# Patient Record
Sex: Female | Born: 1970 | Race: White | Hispanic: Yes | Marital: Single | Smoking: Never smoker
Health system: Southern US, Community
[De-identification: ages and names within clinical notes are randomized; demographics above are authoritative.]

## PROBLEM LIST (undated history)

## (undated) DIAGNOSIS — R87629 Unspecified abnormal cytological findings in specimens from vagina: Secondary | ICD-10-CM

## (undated) DIAGNOSIS — O009 Unspecified ectopic pregnancy without intrauterine pregnancy: Secondary | ICD-10-CM

## (undated) DIAGNOSIS — Z87442 Personal history of urinary calculi: Secondary | ICD-10-CM

## (undated) DIAGNOSIS — N200 Calculus of kidney: Secondary | ICD-10-CM

## (undated) DIAGNOSIS — Z9889 Other specified postprocedural states: Secondary | ICD-10-CM

## (undated) DIAGNOSIS — R112 Nausea with vomiting, unspecified: Secondary | ICD-10-CM

## (undated) DIAGNOSIS — I10 Essential (primary) hypertension: Secondary | ICD-10-CM

## (undated) HISTORY — DX: Unspecified ectopic pregnancy without intrauterine pregnancy: O00.90

## (undated) HISTORY — DX: Essential (primary) hypertension: I10

## (undated) HISTORY — PX: EYE SURGERY: SHX253

## (undated) HISTORY — DX: Calculus of kidney: N20.0

## (undated) HISTORY — DX: Unspecified abnormal cytological findings in specimens from vagina: R87.629

## (undated) HISTORY — PX: ECTOPIC PREGNANCY SURGERY: SHX613

---

## 2005-02-23 ENCOUNTER — Inpatient Hospital Stay (HOSPITAL_COMMUNITY): Admission: EM | Admit: 2005-02-23 | Discharge: 2005-02-25 | Payer: Self-pay | Admitting: Emergency Medicine

## 2011-12-26 ENCOUNTER — Other Ambulatory Visit (HOSPITAL_COMMUNITY): Payer: Self-pay | Admitting: Nurse Practitioner

## 2011-12-26 DIAGNOSIS — Z139 Encounter for screening, unspecified: Secondary | ICD-10-CM

## 2012-01-08 ENCOUNTER — Ambulatory Visit (HOSPITAL_COMMUNITY): Payer: Self-pay

## 2012-01-11 ENCOUNTER — Ambulatory Visit (HOSPITAL_COMMUNITY)
Admission: RE | Admit: 2012-01-11 | Discharge: 2012-01-11 | Disposition: A | Discharge status: Home / Self Care | Payer: Self-pay | Source: Ambulatory Visit | Attending: Nurse Practitioner | Admitting: Nurse Practitioner

## 2012-01-11 DIAGNOSIS — Z139 Encounter for screening, unspecified: Secondary | ICD-10-CM

## 2012-01-17 ENCOUNTER — Other Ambulatory Visit (HOSPITAL_COMMUNITY): Payer: Self-pay | Admitting: Nurse Practitioner

## 2012-01-17 DIAGNOSIS — N6310 Unspecified lump in the right breast, unspecified quadrant: Secondary | ICD-10-CM

## 2012-01-17 DIAGNOSIS — N6320 Unspecified lump in the left breast, unspecified quadrant: Secondary | ICD-10-CM

## 2012-01-31 ENCOUNTER — Other Ambulatory Visit (HOSPITAL_COMMUNITY): Payer: Self-pay | Admitting: Nurse Practitioner

## 2012-01-31 ENCOUNTER — Ambulatory Visit (HOSPITAL_COMMUNITY)
Admission: RE | Admit: 2012-01-31 | Discharge: 2012-01-31 | Disposition: A | Discharge status: Home / Self Care | Payer: PRIVATE HEALTH INSURANCE | Source: Ambulatory Visit | Attending: Nurse Practitioner | Admitting: Nurse Practitioner

## 2012-01-31 DIAGNOSIS — N6320 Unspecified lump in the left breast, unspecified quadrant: Secondary | ICD-10-CM

## 2012-01-31 DIAGNOSIS — R928 Other abnormal and inconclusive findings on diagnostic imaging of breast: Secondary | ICD-10-CM | POA: Insufficient documentation

## 2012-01-31 DIAGNOSIS — N6310 Unspecified lump in the right breast, unspecified quadrant: Secondary | ICD-10-CM

## 2012-08-22 ENCOUNTER — Other Ambulatory Visit: Payer: Self-pay | Admitting: Nurse Practitioner

## 2012-08-22 ENCOUNTER — Other Ambulatory Visit (HOSPITAL_COMMUNITY): Payer: Self-pay | Admitting: Nurse Practitioner

## 2012-08-22 DIAGNOSIS — N6310 Unspecified lump in the right breast, unspecified quadrant: Secondary | ICD-10-CM

## 2012-09-11 ENCOUNTER — Ambulatory Visit (HOSPITAL_COMMUNITY)
Admission: RE | Admit: 2012-09-11 | Discharge: 2012-09-11 | Disposition: A | Discharge status: Home / Self Care | Payer: PRIVATE HEALTH INSURANCE | Source: Ambulatory Visit | Attending: Nurse Practitioner | Admitting: Nurse Practitioner

## 2012-09-11 ENCOUNTER — Encounter (HOSPITAL_COMMUNITY): Payer: Self-pay

## 2012-09-11 DIAGNOSIS — Z09 Encounter for follow-up examination after completed treatment for conditions other than malignant neoplasm: Secondary | ICD-10-CM | POA: Insufficient documentation

## 2012-09-11 DIAGNOSIS — N6310 Unspecified lump in the right breast, unspecified quadrant: Secondary | ICD-10-CM

## 2012-09-11 DIAGNOSIS — N6489 Other specified disorders of breast: Secondary | ICD-10-CM | POA: Insufficient documentation

## 2013-03-11 ENCOUNTER — Other Ambulatory Visit (HOSPITAL_COMMUNITY): Payer: Self-pay | Admitting: *Deleted

## 2013-03-11 DIAGNOSIS — Z09 Encounter for follow-up examination after completed treatment for conditions other than malignant neoplasm: Secondary | ICD-10-CM

## 2013-03-11 DIAGNOSIS — N6489 Other specified disorders of breast: Secondary | ICD-10-CM

## 2013-03-11 DIAGNOSIS — R928 Other abnormal and inconclusive findings on diagnostic imaging of breast: Secondary | ICD-10-CM

## 2013-03-19 ENCOUNTER — Ambulatory Visit (HOSPITAL_COMMUNITY)
Admission: RE | Admit: 2013-03-19 | Discharge: 2013-03-19 | Disposition: A | Discharge status: Home / Self Care | Payer: PRIVATE HEALTH INSURANCE | Source: Ambulatory Visit | Attending: *Deleted | Admitting: *Deleted

## 2013-03-19 DIAGNOSIS — Z09 Encounter for follow-up examination after completed treatment for conditions other than malignant neoplasm: Secondary | ICD-10-CM

## 2013-03-19 DIAGNOSIS — N6489 Other specified disorders of breast: Secondary | ICD-10-CM

## 2013-03-19 DIAGNOSIS — Z1231 Encounter for screening mammogram for malignant neoplasm of breast: Secondary | ICD-10-CM | POA: Insufficient documentation

## 2013-03-19 DIAGNOSIS — R928 Other abnormal and inconclusive findings on diagnostic imaging of breast: Secondary | ICD-10-CM

## 2014-12-31 ENCOUNTER — Other Ambulatory Visit (HOSPITAL_COMMUNITY): Payer: Self-pay | Admitting: *Deleted

## 2014-12-31 DIAGNOSIS — Z1231 Encounter for screening mammogram for malignant neoplasm of breast: Secondary | ICD-10-CM

## 2015-01-18 ENCOUNTER — Ambulatory Visit (HOSPITAL_COMMUNITY): Payer: Self-pay

## 2015-01-25 ENCOUNTER — Ambulatory Visit (HOSPITAL_COMMUNITY)
Admission: RE | Admit: 2015-01-25 | Discharge: 2015-01-25 | Disposition: A | Discharge status: Home / Self Care | Payer: PRIVATE HEALTH INSURANCE | Source: Ambulatory Visit | Attending: *Deleted | Admitting: *Deleted

## 2015-01-25 DIAGNOSIS — Z1231 Encounter for screening mammogram for malignant neoplasm of breast: Secondary | ICD-10-CM | POA: Insufficient documentation

## 2018-07-31 ENCOUNTER — Other Ambulatory Visit: Payer: Self-pay | Admitting: *Deleted

## 2018-07-31 DIAGNOSIS — R1013 Epigastric pain: Secondary | ICD-10-CM

## 2018-07-31 DIAGNOSIS — R7401 Elevation of levels of liver transaminase levels: Secondary | ICD-10-CM

## 2018-07-31 DIAGNOSIS — R102 Pelvic and perineal pain: Secondary | ICD-10-CM

## 2018-08-05 ENCOUNTER — Other Ambulatory Visit: Payer: Self-pay

## 2018-08-05 ENCOUNTER — Other Ambulatory Visit: Payer: Self-pay | Admitting: *Deleted

## 2018-08-05 ENCOUNTER — Ambulatory Visit (HOSPITAL_COMMUNITY)
Admission: RE | Admit: 2018-08-05 | Discharge: 2018-08-05 | Disposition: A | Discharge status: Home / Self Care | Payer: Self-pay | Source: Ambulatory Visit | Attending: *Deleted | Admitting: *Deleted

## 2018-08-05 DIAGNOSIS — R7401 Elevation of levels of liver transaminase levels: Secondary | ICD-10-CM

## 2018-08-05 DIAGNOSIS — R102 Pelvic and perineal pain: Secondary | ICD-10-CM

## 2018-08-05 DIAGNOSIS — R740 Nonspecific elevation of levels of transaminase and lactic acid dehydrogenase [LDH]: Secondary | ICD-10-CM | POA: Insufficient documentation

## 2018-08-05 DIAGNOSIS — R1013 Epigastric pain: Secondary | ICD-10-CM | POA: Insufficient documentation

## 2020-08-12 ENCOUNTER — Encounter: Payer: Self-pay | Admitting: Women's Health

## 2020-08-12 ENCOUNTER — Ambulatory Visit: Payer: Self-pay | Admitting: Women's Health

## 2020-08-12 ENCOUNTER — Other Ambulatory Visit: Payer: Self-pay

## 2020-08-12 ENCOUNTER — Other Ambulatory Visit (HOSPITAL_COMMUNITY)
Admission: RE | Admit: 2020-08-12 | Discharge: 2020-08-12 | Disposition: A | Discharge status: Home / Self Care | Payer: Self-pay | Source: Ambulatory Visit | Attending: Obstetrics & Gynecology | Admitting: Obstetrics & Gynecology

## 2020-08-12 VITALS — BP 124/72 | HR 88 | Ht 61.0 in | Wt 219.0 lb

## 2020-08-12 DIAGNOSIS — N3946 Mixed incontinence: Secondary | ICD-10-CM

## 2020-08-12 DIAGNOSIS — R351 Nocturia: Secondary | ICD-10-CM

## 2020-08-12 DIAGNOSIS — Z124 Encounter for screening for malignant neoplasm of cervix: Secondary | ICD-10-CM

## 2020-08-12 DIAGNOSIS — N9089 Other specified noninflammatory disorders of vulva and perineum: Secondary | ICD-10-CM

## 2020-08-12 DIAGNOSIS — R300 Dysuria: Secondary | ICD-10-CM

## 2020-08-12 DIAGNOSIS — R350 Frequency of micturition: Secondary | ICD-10-CM

## 2020-08-12 NOTE — Progress Notes (Signed)
WELL-WOMAN EXAMINATION Patient name: Brianna [name] MRN [id number]  Date of birth: 07-Aug-1970 Chief Complaint:   vaginal irritation and Urinary Frequency (Leaking urine, constantly wet)  History of Present Illness:   Brianna [name] is a 50 y.o. G13P0020 Hispanic female being seen today for a routine well-woman exam.  Current complaints: urinary incontinence x 24yrs-worse x 99yrs, leaks w/ coughing/sneezing, w/ urge to void, and sometimes leaks randomly. Always wet from urine, has chronic vaginal irritation, and strong odor. Urinary frequency, dysuria. Voids at least 20x/night, feels like she is up every minute. Generalized lower abdominal pain, feels like something is swollen. No sex in 80yrs, never found pleasure in sex, painful. No orgasm. Periods stopped @ 50yo. +hot flashes.  Normal pelvic u/s 38yrs ago.  PCP: [facility]      No LMP recorded. (Menstrual status: Irregular Periods). The current method of family planning is abstinence and post menopausal status.  Last pap 55yrs ago. Results were: negative per pt report at [facility] . H/O abnormal pap: no Last mammogram: 2017. Results were: normal. Family h/o breast cancer: no Last colonoscopy: never. Results were: N/A. Family h/o colorectal cancer: no  Depression screen [facility] 2/9 08/12/2020  Decreased Interest 3  Down, Depressed, Hopeless 3  PHQ - 2 Score 6  Altered sleeping 3  Tired, decreased energy 3  Change in appetite 0  Feeling bad or failure about yourself  2  Trouble concentrating 2  Moving slowly or fidgety/restless 0  Suicidal thoughts 0  PHQ-9 Score 16     GAD 7 : Generalized Anxiety Score 08/12/2020  Nervous, Anxious, on Edge 1  Control/stop worrying 3  Worry too much - different things 1  Trouble relaxing 3  Restless 0  Easily annoyed or irritable 1  Afraid - awful might happen 3  Total GAD 7 Score 12     Review of Systems:   Pertinent items are noted in HPI Denies any headaches, blurred vision, fatigue, shortness  of breath, chest pain, abdominal pain, abnormal vaginal discharge/itching/odor/irritation, problems with periods, bowel movements, urination, or intercourse unless otherwise stated above. Pertinent History Reviewed:  Reviewed past medical,surgical, social and family history.  Reviewed problem list, medications and allergies. Physical Assessment:   Vitals:   08/12/20 1445  BP: 124/72  Pulse: 88  Weight: 219 lb (99.3 kg)  Height: 5\' 1"  (1.549 m)  Body mass index is 41.38 kg/m.        Physical Examination:   General appearance - well appearing, and in no distress  Mental status - alert, oriented to person, place, and time  Psych:  She has a normal mood and affect  Skin - warm and dry, normal color, no suspicious lesions noted  Chest - effort normal, all lung fields clear to auscultation bilaterally  Heart - normal rate and regular rhythm  Neck:  midline trachea, no thyromegaly or nodules  Breasts - breasts appear normal, no suspicious masses, no skin or nipple changes or  axillary nodes  Abdomen - soft, nontender, nondistended, no masses or organomegaly  Pelvic - VULVA: normal appearing vulva with no masses, tenderness or lesions  VAGINA: no obvious cystocele; normal appearing vagina with normal color and discharge, no lesions  CERVIX: normal appearing cervix without discharge or lesions, no CMT  Thin prep pap is done w/ HR HPV cotesting  UTERUS: uterus is felt to be normal size, shape, consistency and nontender   ADNEXA: No adnexal masses or tenderness noted.  Extremities:  No swelling or varicosities noted  Chaperone:  Latisha Cresenzo    No results found for this or any previous visit (from the past 24 hour(s)).  Assessment & Plan:  1) Well-Woman Exam, postmenopausal  2) Mixed urinary incontinence w/ frequency, dysuria, nocturia> send urine cx, gave Mybretriq 50mg  qhs ( of samples), f/u w/ MD  3) Vulvar irritation> likely from incontinence, hopefully will improve w/  myrbetriq  Labs/procedures today: pap, CV swab, urine cx  Mammogram:  past due for screening, self-pay, will see if can get set up w/ [facility]P Colonoscopy:  past due for screening -will see if there are programs for this  Orders Placed This Encounter  Procedures   Urine Culture    Meds: No orders of the defined types were placed in this encounter.   Follow-up: Return in about 2 months (around 10/12/2020) for MD only.  12/12/2020 CNM, [facility] 08/12/2020 4:41 PM

## 2020-08-13 NOTE — Progress Notes (Signed)
This encounter was created in error - please disregard. Visit type was changed.

## 2020-08-15 LAB — URINE CULTURE

## 2020-08-20 LAB — CYTOLOGY - PAP
Chlamydia: NEGATIVE
Comment: NEGATIVE
Comment: NEGATIVE
Comment: NEGATIVE
Comment: NORMAL
HPV 16: NEGATIVE
HPV 18 / 45: NEGATIVE
High risk HPV: POSITIVE — AB
Neisseria Gonorrhea: NEGATIVE

## 2020-08-26 ENCOUNTER — Encounter: Payer: Self-pay | Admitting: Women's Health

## 2020-09-21 ENCOUNTER — Encounter: Payer: Self-pay | Admitting: Women's Health

## 2020-09-21 DIAGNOSIS — R87619 Unspecified abnormal cytological findings in specimens from cervix uteri: Secondary | ICD-10-CM | POA: Insufficient documentation

## 2020-10-14 ENCOUNTER — Ambulatory Visit: Payer: Self-pay | Admitting: Obstetrics & Gynecology

## 2020-10-15 ENCOUNTER — Other Ambulatory Visit (HOSPITAL_COMMUNITY): Payer: Self-pay | Admitting: Obstetrics and Gynecology

## 2020-10-15 DIAGNOSIS — Z1231 Encounter for screening mammogram for malignant neoplasm of breast: Secondary | ICD-10-CM

## 2020-10-29 ENCOUNTER — Other Ambulatory Visit: Payer: Self-pay

## 2020-10-29 ENCOUNTER — Inpatient Hospital Stay (HOSPITAL_COMMUNITY): Payer: Self-pay | Attending: Obstetrics and Gynecology | Admitting: *Deleted

## 2020-10-29 ENCOUNTER — Ambulatory Visit (HOSPITAL_COMMUNITY)
Admission: RE | Admit: 2020-10-29 | Discharge: 2020-10-29 | Disposition: A | Discharge status: Home / Self Care | Payer: Self-pay | Source: Ambulatory Visit | Attending: Obstetrics and Gynecology | Admitting: Obstetrics and Gynecology

## 2020-10-29 VITALS — BP 128/78 | Wt 217.0 lb

## 2020-10-29 DIAGNOSIS — Z1239 Encounter for other screening for malignant neoplasm of breast: Secondary | ICD-10-CM

## 2020-10-29 DIAGNOSIS — Z1211 Encounter for screening for malignant neoplasm of colon: Secondary | ICD-10-CM

## 2020-10-29 DIAGNOSIS — Z1231 Encounter for screening mammogram for malignant neoplasm of breast: Secondary | ICD-10-CM | POA: Insufficient documentation

## 2020-10-29 DIAGNOSIS — R87612 Low grade squamous intraepithelial lesion on cytologic smear of cervix (LGSIL): Secondary | ICD-10-CM

## 2020-10-29 NOTE — Progress Notes (Signed)
Brianna [name] is a 50 y.o. female who presents to [facility] clinic today with no complaints. Patient referred to B[facility]CP by [facility] for [facility] at [facility] due to having an abnormal Pap smear 08/12/2020 that a colposcopy is recommended for follow up.   Pap Smear: Pap smear not completed today. Last Pap smear was 08/12/2020 at [facility] for [facility] at [facility] clinic and was abnormal - LSIL with positive HPV . Per patient has no history of an abnormal Pap smear prior to her most recent Pap smear. Last Pap smear result is available in Epic.   Physical exam: Breasts Breasts symmetrical. No skin abnormalities bilateral breasts. No nipple retraction bilateral breasts. No nipple discharge bilateral breasts. No lymphadenopathy. No lumps palpated bilateral breasts. No complaints of pain or tenderness on exam.   Pelvic/Bimanual Pap is not indicated today per BCCCP guidelines.   Smoking History: Patient has never smoked.   Patient Navigation: Patient education provided. Access to services provided for patient through [employer] program. Spanish interpreter Lynnell Chad from [employer] provided.   Colorectal Cancer Screening: Per patient has never had colonoscopy completed. FIT Test given to patient to complete. No complaints today.    Breast and Cervical Cancer Risk Assessment: Patient does not have family history of breast cancer, known genetic mutations, or radiation treatment to the chest before age 36. Patient does not have history of cervical dysplasia, immunocompromised, or DES exposure in-utero.  Risk Assessment     Risk Scores       10/29/2020   Last edited by: Priscille Heidelberg, RN   5-year risk: 0.9 %   Lifetime risk: 7.5 %           A: BCCCP exam without pap smear No complaints.  P: Referred patient to the [facility] for a screening mammogram. Appointment scheduled Friday, October 29, 2020 at  1030.  Referred patient to the [facility] for [facility] for a colposcopy to follow-up for her abnormal Pap smear. Appointment scheduled Wednesday, November 03, 2020 at 1330.  Priscille Heidelberg, RN 10/29/2020 10:16 AM

## 2020-10-29 NOTE — Patient Instructions (Signed)
Explained breast self awareness with Brianna [name]. Patient did not need a Pap smear today due to last Pap smear was 08/12/2020. Explained the colposcopy the recommended follow-up for her abnormal Pap smear. Referred patient to the [facility] for [facility] for a colposcopy to follow-up for her abnormal Pap smear. Appointment scheduled Wednesday, November 03, 2020 at 1330. Patient aware of appointment and will be there. Referred patient to the [facility] for a screening mammogram. Appointment scheduled Friday, October 29, 2020 at 1030. Patient escorted to mammography following BCCCP appointment for her screening mammogram. Let patient know [name] will follow up with her within the next couple weeks with results her mammogram by letter or phone. Brianna [name] verbalized understanding.  Amarii Amy, Kathaleen Maser, RN 10:16 AM

## 2020-11-03 ENCOUNTER — Ambulatory Visit (INDEPENDENT_AMBULATORY_CARE_PROVIDER_SITE_OTHER): Payer: Self-pay | Admitting: Women's Health

## 2020-11-03 ENCOUNTER — Other Ambulatory Visit: Payer: Self-pay

## 2020-11-03 ENCOUNTER — Other Ambulatory Visit (HOSPITAL_COMMUNITY)
Admission: RE | Admit: 2020-11-03 | Discharge: 2020-11-03 | Disposition: A | Discharge status: Home / Self Care | Payer: Self-pay | Source: Ambulatory Visit | Attending: Women's Health | Admitting: Women's Health

## 2020-11-03 ENCOUNTER — Encounter: Payer: Self-pay | Admitting: Women's Health

## 2020-11-03 VITALS — BP 120/77 | HR 89 | Ht 61.5 in | Wt 219.0 lb

## 2020-11-03 DIAGNOSIS — R87612 Low grade squamous intraepithelial lesion on cytologic smear of cervix (LGSIL): Secondary | ICD-10-CM

## 2020-11-03 DIAGNOSIS — N3946 Mixed incontinence: Secondary | ICD-10-CM

## 2020-11-03 DIAGNOSIS — N841 Polyp of cervix uteri: Secondary | ICD-10-CM

## 2020-11-03 MED ORDER — MIRABEGRON ER 50 MG PO TB24: 50.0000 mg | ORAL_TABLET | Freq: Every day | ORAL | 6 refills | Status: DC

## 2020-11-03 NOTE — Progress Notes (Signed)
   COLPOSCOPY PROCEDURE NOTE Patient name: Brianna [name] MRN [id number]  Date of birth: 1970/06/13 Subjective Findings:   Brianna [name] is a 50 y.o. G101P0020 Hispanic female being seen today for a colposcopy.  Had seen pt in Aug, was having mixed urinary incontinence- gave sample of mybretriq 50mg , states has helped some, needs more samples. Had mammogram 10/21- neg. Had BCCCP appt 10/21, was given FIT testing for colorectal cancer screening- has done cards and mailed back today. Indication: Abnormal pap on 08/12/20: LSIL w/ HRHPV positive: other (not 16, 18/45)  Prior cytology:  Date Result Procedure  2015 NILM w/ HRHPV negative None              Patient's last menstrual period was 01/20/2015. Contraception: post menopausal status. Menopausal: yes @ 50yo . Hysterectomy: no.   Smoker: no. Immunocompromised: no.   The risks and benefits were explained and informed consent was obtained, and written copy is in chart. Pertinent History Reviewed:   Reviewed past medical,surgical, social, obstetrical and family history.  Reviewed problem list, medications and allergies. Objective Findings & Procedure:   Vitals:   11/03/20 1345  BP: 120/77  Pulse: 89  Weight: 219 lb (99.3 kg)  Height: 5' 1.5" (1.562 m)  Body mass index is 40.71 kg/m.  No results found for this or any previous visit (from the past 24 hour(s)).   Time out was performed.  Speculum placed in the vagina, cervix fully visualized. SCJ: not fully visualized. Cervix swabbed x 3 with acetic acid.  Acetowhitening present: Yes Cervix: no visible lesions, no mosaicism, no punctation, no abnormal vasculature, acetowhite lesion(s) noted at 9 & 3 o'clock, and polyp noted inside the os- removed w/ ring forceps w/o difficulty. 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 only.  Brianna [name] CNM, [facility] 11/03/2020 4:32 PM

## 2020-11-05 LAB — SURGICAL PATHOLOGY

## 2020-11-11 ENCOUNTER — Telehealth: Payer: Self-pay | Admitting: Women's Health

## 2020-11-11 NOTE — Telephone Encounter (Signed)
Spoke with Patient regarding her Colposcopy Biopsy in spanish. I informed all of the information that was provider by Mrs. Brianna [name] an Patient verbalized understanding.

## 2020-11-17 LAB — FECAL OCCULT BLOOD, IMMUNOCHEMICAL

## 2020-11-22 ENCOUNTER — Other Ambulatory Visit: Payer: Self-pay

## 2020-11-22 DIAGNOSIS — Z1211 Encounter for screening for malignant neoplasm of colon: Secondary | ICD-10-CM

## 2020-12-27 ENCOUNTER — Ambulatory Visit: Payer: Self-pay | Admitting: Obstetrics & Gynecology

## 2021-05-10 ENCOUNTER — Ambulatory Visit: Payer: 59 | Admitting: Adult Health

## 2021-05-10 ENCOUNTER — Encounter: Payer: Self-pay | Admitting: Adult Health

## 2021-05-10 VITALS — BP 147/78 | HR 72 | Ht 62.0 in | Wt 222.0 lb

## 2021-05-10 DIAGNOSIS — R350 Frequency of micturition: Secondary | ICD-10-CM | POA: Diagnosis not present

## 2021-05-10 DIAGNOSIS — N3946 Mixed incontinence: Secondary | ICD-10-CM

## 2021-05-10 DIAGNOSIS — N951 Menopausal and female climacteric states: Secondary | ICD-10-CM | POA: Diagnosis not present

## 2021-05-10 DIAGNOSIS — R300 Dysuria: Secondary | ICD-10-CM | POA: Diagnosis not present

## 2021-05-10 LAB — POCT URINALYSIS DIPSTICK
Blood, UA: NEGATIVE
Glucose, UA: NEGATIVE
Ketones, UA: NEGATIVE
Leukocytes, UA: NEGATIVE
Nitrite, UA: NEGATIVE
Protein, UA: NEGATIVE

## 2021-05-10 MED ORDER — MIRABEGRON ER 50 MG PO TB24: 50.0000 mg | ORAL_TABLET | Freq: Every day | ORAL | 6 refills | Status: DC

## 2021-05-10 MED ORDER — PREMARIN 0.625 MG/GM VA CREA: TOPICAL_CREAM | VAGINAL | 1 refills | Status: DC

## 2021-05-10 NOTE — Progress Notes (Signed)
?  Subjective:  ?  ? Patient ID: Brianna [name], female   DOB: 29-Jul-1970, 51 y.o.   MRN: [id number] ? ?HPI ?Brianna [name] is a 51 year old Hispanic female, single, PM in complaining of urinary frequency with burning and leakage. She is on myrbetriq, and it is not helping that much any more. Lily an interpreter is with her.  ? ?Lab Results  ?Component Value Date  ? 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 PCP is Grier Rocher NP at [facility]. ? ?Review of Systems ?+urinary frequency ?+burning with urination ?Has mixed UI ?Reviewed past medical,surgical, social and family history. Reviewed medications and allergies.  ?   ?Objective:  ? Physical Exam ?BP (!) 147/78 (BP Location: Left Arm, Patient Position: Sitting, Cuff Size: Large)   Pulse 72   Ht 5\' 2"  (1.575 m)   Wt 222 lb (100.7 kg)   LMP 01/20/2015   BMI 40.60 kg/m?  urine dipstick was negative  ?  Skin warm and dry.Pelvic: external genitalia is normal in appearance no lesions, vagina: pale red and loss of rugae and moisture,urethra has no lesions or masses noted, cervix:bulbous, uterus: normal size, shape and contour, non tender, no masses felt, adnexa: no masses or tenderness noted. Bladder is non tender and no masses felt. Fall risk is low ? Upstream - 05/10/21 1416   ? ?  ? Pregnancy Intention Screening  ? Does the patient want to become pregnant in the next year? No   ? Does the patient's partner want to become pregnant in the next year? No   ? Would the patient like to discuss contraceptive options today? No   ?  ? Contraception Wrap Up  ? Current Method No Method - Other Reason   postmenopausal  ? End Method No Method - Other Reason   postmenopausal  ? Contraception Counseling Provided No   ? ?  ?  ? ?  ? Examination chaperoned by 07/10/21 LPN. ? ?Assessment:  ?1. Urinary frequency ?Will send urine for UA C&S ?- POCT Urinalysis Dipstick ?- Ambulatory referral to Urology ?Continue  myrbetriq 50 mg 1 daily til after sees urology, meds may be changed then  ? ?2. Burning with urination ?Urine sent for UA C&S to rule out UTI ? ?3. Mixed stress and urge urinary incontinence ?Will refer to Urology for evaluation, [facility]  ?- Ambulatory referral to Urology ? ?4. Vaginal dryness, menopausal ?Will try premarin vaginal cream, 0.5 gm in vagina at HS for 2 weeks then 3 x weekly 12 gm given and rx sent ?Meds ordered this encounter  ?Medications  ? conjugated estrogens (PREMARIN) vaginal cream  ?  Sig: Use 0.5 gm in vagina at bedtime for 2 weeks then 3 x weekly  ?  Dispense:  42.5 g  ?  Refill:  1  ?  Order Specific Question:   Supervising Provider  ?  Answer:   [name] H [[id number]]  ? mirabegron ER (MYRBETRIQ) 50 MG TB24 tablet  ?  Sig: Take 1 tablet (50 mg total) by mouth at bedtime.  ?  Dispense:  30 tablet  ?  Refill:  6  ?  Order Specific Question:   Supervising Provider  ?  Answer:   Duane Lope H [[id number]]  ?  ?   ?Plan:  ?   ?Return 08/15/21 for pap and physical with 10/15/21 B ?   ?

## 2021-05-13 ENCOUNTER — Other Ambulatory Visit: Payer: Self-pay | Admitting: Adult Health

## 2021-05-13 LAB — URINALYSIS, ROUTINE W REFLEX MICROSCOPIC
Bilirubin, UA: NEGATIVE
Glucose, UA: NEGATIVE
Ketones, UA: NEGATIVE
Leukocytes,UA: NEGATIVE
Nitrite, UA: NEGATIVE
Protein,UA: NEGATIVE
RBC, UA: NEGATIVE
Specific Gravity, UA: 1.006 (ref 1.005–1.030)
Urobilinogen, Ur: 0.2 mg/dL (ref 0.2–1.0)
pH, UA: 6 (ref 5.0–7.5)

## 2021-05-13 LAB — URINE CULTURE

## 2021-05-13 LAB — SPECIMEN STATUS REPORT

## 2021-05-13 MED ORDER — SULFAMETHOXAZOLE-TRIMETHOPRIM 800-160 MG PO TABS: 1.0000 | ORAL_TABLET | Freq: Two times a day (BID) | ORAL | 0 refills | Status: DC

## 2021-05-13 NOTE — Progress Notes (Signed)
Urine culture was +klebsiella will rx septra ds  ?

## 2021-05-16 ENCOUNTER — Ambulatory Visit: Payer: 59 | Admitting: Physician Assistant

## 2021-05-16 VITALS — BP 115/81 | HR 94 | Ht 61.0 in | Wt 218.0 lb

## 2021-05-16 DIAGNOSIS — R1010 Upper abdominal pain, unspecified: Secondary | ICD-10-CM | POA: Diagnosis not present

## 2021-05-16 DIAGNOSIS — R350 Frequency of micturition: Secondary | ICD-10-CM

## 2021-05-16 DIAGNOSIS — R109 Unspecified abdominal pain: Secondary | ICD-10-CM

## 2021-05-16 DIAGNOSIS — N3000 Acute cystitis without hematuria: Secondary | ICD-10-CM

## 2021-05-16 DIAGNOSIS — N952 Postmenopausal atrophic vaginitis: Secondary | ICD-10-CM

## 2021-05-16 DIAGNOSIS — K5900 Constipation, unspecified: Secondary | ICD-10-CM

## 2021-05-16 LAB — URINALYSIS, ROUTINE W REFLEX MICROSCOPIC
Bilirubin, UA: NEGATIVE
Glucose, UA: NEGATIVE
Ketones, UA: NEGATIVE
Nitrite, UA: NEGATIVE
Protein,UA: NEGATIVE
Specific Gravity, UA: 1.02 (ref 1.005–1.030)
Urobilinogen, Ur: 2 mg/dL — ABNORMAL HIGH (ref 0.2–1.0)
pH, UA: 7 (ref 5.0–7.5)

## 2021-05-16 LAB — MICROSCOPIC EXAMINATION: Renal Epithel, UA: NONE SEEN /hpf

## 2021-05-16 LAB — BLADDER SCAN AMB NON-IMAGING: Scan Result: 1

## 2021-05-16 MED ORDER — NITROFURANTOIN MONOHYD MACRO 100 MG PO CAPS: 100.0000 mg | ORAL_CAPSULE | Freq: Two times a day (BID) | ORAL | 0 refills | Status: AC

## 2021-05-16 NOTE — Progress Notes (Signed)
? ?05/16/2021 ?3:11 PM  ? ?Brianna [name] ?26-Apr-1970 ?[id number] ? ? ?Assessment: ? ?1. Urinary frequency ?- BLADDER SCAN AMB NON-IMAGING ?- Urinalysis, Routine w reflex microscopic ? ?2. Acute cystitis without hematuria ? ?3. Flank pain ? ?4. Pain of upper abdomen ?- CT RENAL STONE STUDY ? ?5. Atrophic vaginitis ? ? ? ?Plan: ?Urine culture ordered. Macrobid x 7 days. CT stone study to eval flank pain and urinary sxs.  Patient will remain on Myrbetriq until follow-up after her stone study for recheck of her urine and further evaluation. MiraLAX recommended for constipation. Explained the plan in detail to the patient via the interpreter.  She is encouraged to continue using the vaginal cream and was instructed on how this should be inserted.  She is advised to start showering rather than taking baths due to the risk of causing UTIs.  Attempts were made to clear up her confusion about her ultrasound performed in 2020 and information is given to her about cholelithiasis.  She conveys that she understands this is not a urologic issue and does not need urgent attention. ? ?Chief Complaint: No chief complaint on file. ? ? ?Referring provider: Adline Potter, NP ?[address] ?[address] ?[place],  [place] [address] ? ? ?History of Present Illness: ?[name] interpreter used during pt's OV ?Brianna [name] is a 51 y.o. year old female who is seen in consultation from Vertis Kelch, NP for evaluation of recurrent UTIs and mixed urinary incontinence.  Her translation from the interpreter, the patient complains of approximately 1 year history of recurrent urinary tract infections with persistent burning, urgency, incontinence, and nocturia.  She also reports persistent right-sided flank pain which has been intermittent over the past year.  Currently, the patient states she wears approximately 3 pads per day due to her urge and stress type incontinence.  She wears no pads at night but reports having to void at least  10 times.  Through translation, she is unable to understand whether this is a significant amount or mounts of urine at night.  She also reports weakness of stream as well as pain with intercourse.  US imaging in the past indicated gallstones, but patient was under the impression that she had kidney stones.  She denies history of kidney stones in the past but is convinced that she has them now.  She also admits to vaginal itching.  No current fever, chills, abdominal pain, gross hematuria. She has chronic constipation. No diarrhea. The patient admits to regular soda intake and reports daily baths rather than showers.  The patient is currently on Myrbetriq 50 mg without improvement of her sxs and was prescribed vaginal cream on 05/10/2021 for diagnosis of atrophic vaginitis. Currently on Bactrim from recent dx of UTI ?Urine cultures: 08/13/2020, mixed urogenital flora, less than 10,000 colonies ?5-23, Klebsiella pneumoniae greater than 100,000 colonies, resistant to ampicillin and intermediate resistance to nitrofurantoin ? ?UA=6-10WBC, moderate bacteria, nitrite negative ?PVR = 1 mL ?Medical records including results and imaging studies reviewed during patient's office visit. ? ?Past Medical History:  ?Past Medical History:  ?Diagnosis Date  ? Ectopic pregnancy   ? Hypertension   ? Kidney stones   ? Vaginal Pap smear, abnormal   ? ? ?Past Surgical History:  ?Past Surgical History:  ?Procedure Laterality Date  ? EYE SURGERY    ? ? ?Allergies:  ?No Known Allergies ? ?Family History:  ?Family History  ?Problem Relation Age of Onset  ? Stroke Father   ? Cancer Mother   ?  stomach  ? Diabetes Brother   ? Hypertension Brother   ? Diabetes Sister   ? Hypertension Sister   ? ? ?Social History:  ?Social History  ? ?Tobacco Use  ? Smoking status: Never  ? Smokeless tobacco: Never  ?Vaping Use  ? Vaping Use: Never used  ?Substance Use Topics  ? Alcohol use: Not Currently  ? Drug use: Never  ? ? ?Review of symptoms:   ?Constitutional:  Negative for unexplained weight loss, fever, chills ?ENT:  Negative for nose bleeds, painful swallowing ?CV:  Negative for chest pain, shortness of breath, exercise intolerance, palpitations, loss of consciousness ?Resp:  Negative for cough, wheezing, shortness of breath ?GI:  Negative for nausea, vomiting, diarrhea, bloody stools ?GU:  Positives noted in HPI ?Neuro:  Negative for seizures, poor balance, limb weakness, slurred speech ?Psych:  Negative for lack of energy, depression, anxiety ?Endocrine:  Negative for polydipsia, polyuria, symptoms of hypoglycemia (dizziness, hunger, sweating) ?Hematologic:  Negative for anemia, purpura, petechia, prolonged or excessive bleeding, use of anticoagulants  ? ?Physical Exam: ?BP 115/81   Pulse 94   Ht 5\' 1"  (1.549 m)   Wt 218 lb (98.9 kg)   LMP 01/20/2015   BMI 41.19 kg/m?   ?Constitutional:  Alert and oriented, No acute distress. ?HEENT: NCAT, moist mucus membranes.  Trachea midline, no masses. ?Cardiovascular: Regular rate and rhythm without murmur, rub, or gallops ?No clubbing, cyanosis, or edema. ?Respiratory: Normal respiratory effort, clear to auscultation bilaterally ?GI: Abdomen is soft, nontender, nondistended, no abdominal masses ?BACK:  Non-tender to palpation.  Mild B CVAT ?Skin: No obvious rashes, warm, dry, intact ?Neurologic: Alert and oriented, Cranial nerves grossly intact, no focal deficits, moving all 4 extremities. ?Psychiatric: Appropriate. Normal mood and affect. ? ?Laboratory Data: ?No results found for this or any previous visit (from the past 24 hour(s)). ? ? ? ?Urinalysis ?   ?Component Value Date/Time  ? APPEARANCEUR Clear 05/10/2021 0000  ? GLUCOSEU Negative 05/10/2021 0000  ? BILIRUBINUR Negative 05/10/2021 0000  ? PROTEINUR Negative 05/10/2021 1417  ? PROTEINUR Negative 05/10/2021 0000  ? NITRITE neg 05/10/2021 1417  ? NITRITE Negative 05/10/2021 0000  ? LEUKOCYTESUR Negative 05/10/2021 1417  ? LEUKOCYTESUR Negative  05/10/2021 0000  ? ? ?Lab Results  ?Component Value Date  ? LABMICR Comment 05/10/2021  ? ? ?Pertinent Imaging: ?No results found for this or any previous visit. ? ?No results found for this or any previous visit. ? ? ? ? ?[place], 07/10/2021, PA-C ?[facility] ?  ?

## 2021-05-16 NOTE — Patient Instructions (Signed)
YES- Estrogen Cream ?YES-Lisinopril-hydrochlorothiazide ?YES-Mirabegron ? ?NO-Sulfamethoxazole ? ?Nueve-Nitrofurantoin ?

## 2021-05-16 NOTE — Progress Notes (Signed)
post void residual =1mL 

## 2021-05-26 ENCOUNTER — Telehealth: Payer: Self-pay

## 2021-05-26 NOTE — Telephone Encounter (Signed)
Moved patients apt to 06/22/2021, called the language line to interpret message for the patient.  They were unabe to reach her at both numbers and unable to leave voicemail.  Will try again at a later time.

## 2021-05-26 NOTE — Telephone Encounter (Signed)
-----   Message from Julienne Annette Summerlin, PA-C sent at 05/26/2021  4:28 PM EDT ----- This pt's CT stone study hasn't been approved yet. She has FU scheduled on 22, but we need to move that out until she has the CT ----- Message ----- From: SYSTEM Sent: 05/21/2021  12:12 AM EDT To: Julienne Annette Summerlin, PA-C   

## 2021-05-30 ENCOUNTER — Ambulatory Visit: Payer: 59 | Admitting: Physician Assistant

## 2021-05-31 ENCOUNTER — Telehealth: Payer: Self-pay

## 2021-05-31 NOTE — Telephone Encounter (Signed)
-----   Message from Brianna [name], [place] sent at 05/26/2021  4:28 PM EDT ----- This pt's CT stone study hasn't been approved yet. She has FU scheduled on 22, but we need to move that out until she has the CT ----- Message ----- From: SYSTEM Sent: 05/21/2021  12:12 AM EDT To: Brianna Levans, PA-C

## 2021-05-31 NOTE — Telephone Encounter (Signed)
R/s apt, pt came in office with an interpreter and front desk informed her that her apt had been move.  Patient also informed to have CT done before her next f/u.  Patient voiced understanding.

## 2021-06-17 ENCOUNTER — Ambulatory Visit (HOSPITAL_COMMUNITY)
Admission: RE | Admit: 2021-06-17 | Discharge: 2021-06-17 | Disposition: A | Discharge status: Home / Self Care | Payer: 59 | Source: Ambulatory Visit | Attending: Physician Assistant | Admitting: Physician Assistant

## 2021-06-17 DIAGNOSIS — R1010 Upper abdominal pain, unspecified: Secondary | ICD-10-CM | POA: Diagnosis present

## 2021-06-17 DIAGNOSIS — N3000 Acute cystitis without hematuria: Secondary | ICD-10-CM | POA: Insufficient documentation

## 2021-06-17 DIAGNOSIS — R109 Unspecified abdominal pain: Secondary | ICD-10-CM | POA: Diagnosis present

## 2021-06-22 ENCOUNTER — Ambulatory Visit: Payer: 59 | Admitting: Physician Assistant

## 2021-06-22 VITALS — BP 115/62 | HR 78

## 2021-06-22 DIAGNOSIS — N3946 Mixed incontinence: Secondary | ICD-10-CM | POA: Diagnosis not present

## 2021-06-22 DIAGNOSIS — N951 Menopausal and female climacteric states: Secondary | ICD-10-CM

## 2021-06-22 DIAGNOSIS — N3000 Acute cystitis without hematuria: Secondary | ICD-10-CM | POA: Diagnosis not present

## 2021-06-22 DIAGNOSIS — R350 Frequency of micturition: Secondary | ICD-10-CM

## 2021-06-22 DIAGNOSIS — K5900 Constipation, unspecified: Secondary | ICD-10-CM

## 2021-06-22 MED ORDER — SULFAMETHOXAZOLE-TRIMETHOPRIM 800-160 MG PO TABS: 1.0000 | ORAL_TABLET | Freq: Two times a day (BID) | ORAL | 0 refills | Status: DC

## 2021-06-22 MED ORDER — GEMTESA 75 MG PO TABS: 75.0000 mg | ORAL_TABLET | Freq: Every day | ORAL | 0 refills | Status: DC

## 2021-06-22 NOTE — Progress Notes (Signed)
post void residual=0 ?

## 2021-06-22 NOTE — Progress Notes (Signed)
Assessment: 1. Acute cystitis without hematuria - Urinalysis, Routine w reflex microscopic - BLADDER SCAN AMB NON-IMAGING - Urine Culture  2. Urinary frequency - Urinalysis, Routine w reflex microscopic - BLADDER SCAN AMB NON-IMAGING  3. Mixed stress and urge urinary incontinence  4. Vaginal dryness, menopausal  5. Constipation, unspecified constipation type    Plan: Urine sent for culture and prescription for Bactrim DS sent to her pharmacy.  Will adjust therapy pending culture results.  Samples of Gemtesa given and she is advised to discontinue the Myrbetriq.  She will continue Premarin cream.  She will follow-up in 6 weeks for UA and PVR.  Possible cystoscopy if symptoms persist or if she is culture positive again.  Chief Complaint: No chief complaint on file.   HPI: Brianna [name] is a 51 y.o. female who presents for continued evaluation of recurrent UTIs and mixed urinary incontinence.  History is obtained via translation interpreter provided by [facility].  Her symptoms improved minimally on Myrbetriq and estrogen cream as previously prescribed, but symptoms recently worsened with urgency, frequency, nocturia up to 10 times per night and states she is always ways wet.  Flank pain has resolved.  No gross hematuria, burning today.  CT stone study indicated no evidence of urinary stone disease.  Discussed CT findings with the patient and again assured her that a gallstone is not related to the urinary symptoms or the urinary system.  UA = 6-10 WBCs, moderate bacteria, nitrite negative.  Culture on 05/10/2021 was positive for Klebsiella pneumonia, greater than 100,000 colonies, resistant to ampicillin with intermediate resistance to Macrobid.  She continues to have issues with constipation as previously discussed  05/16/21 [facility] interpreter used during pt's OV Brianna [name] is a 51 y.o. year old female who is seen in consultation from Vertis KelchAllen, Debra, NP for  evaluation of recurrent UTIs and mixed urinary incontinence.  Her translation from the interpreter, the patient complains of approximately 1 year history of recurrent urinary tract infections with persistent burning, urgency, incontinence, and nocturia.  She also reports persistent right-sided flank pain which has been intermittent over the past year.  Currently, the patient states she wears approximately 3 pads per day due to her urge and stress type incontinence.  She wears no pads at night but reports having to void at least 10 times.  Through translation, she is unable to understand whether this is a significant amount or mounts of urine at night.  She also reports weakness of stream as well as pain with intercourse.  US imaging in the past indicated gallstones, but patient was under the impression that she had kidney stones.  She denies history of kidney stones in the past but is convinced that she has them now.  She also admits to vaginal itching.  No current fever, chills, abdominal pain, gross hematuria. She has chronic constipation. No diarrhea. The patient admits to regular soda intake and reports daily baths rather than showers.  The patient is currently on Myrbetriq 50 mg without improvement of her sxs and was prescribed vaginal cream on 05/10/2021 for diagnosis of atrophic vaginitis. Currently on Bactrim from recent dx of UTI Urine cultures: 08/13/2020, mixed urogenital flora, less than 10,000 colonies 5-23, Klebsiella pneumoniae greater than 100,000 colonies, resistant to ampicillin and intermediate resistance to nitrofurantoin   UA=6-10WBC, moderate bacteria, nitrite negative PVR = 1 mL  Portions of the above documentation were copied from a prior visit for review purposes only.  Allergies: No Known Allergies  PMH: Past  Medical History:  Diagnosis Date   Ectopic pregnancy    Hypertension    Kidney stones    Vaginal Pap smear, abnormal     PSH: Past Surgical History:  Procedure  Laterality Date   EYE SURGERY      SH: Social History   Tobacco Use   Smoking status: Never   Smokeless tobacco: Never  Vaping Use   Vaping Use: Never used  Substance Use Topics   Alcohol use: Not Currently   Drug use: Never    ROS: Constitutional:  Negative for fever, chills, weight loss CV: Negative for chest pain Respiratory:  Negative for shortness of breath, wheezing, sleep apnea, frequent cough GI:  Negative for nausea, vomiting, bloody stool, GERD  PE: BP 115/62   Pulse 78   LMP 01/20/2015  GENERAL APPEARANCE:  Well appearing, well developed, well nourished, NAD HEENT:  Atraumatic, normocephalic NECK:  Supple. Trachea midline ABDOMEN:  Soft, non-tender, no masses EXTREMITIES:  Moves all extremities well, without clubbing, cyanosis, or edema NEUROLOGIC:  Alert and oriented x 3, normal gait, CN II-XII grossly intact MENTAL STATUS:  appropriate BACK:  Non-tender to palpation, No CVAT SKIN:  Warm, dry, and intact   Results: Laboratory Data: No results found for: "WBC", "HGB", "HCT", "MCV", "PLT"  No results found for: "CREATININE"  No results found for: "PSA"  No results found for: "TESTOSTERONE"  No results found for: "HGBA1C"  Urinalysis    Component Value Date/Time   APPEARANCEUR Clear 05/16/2021 1534   GLUCOSEU Negative 05/16/2021 1534   BILIRUBINUR Negative 05/16/2021 1534   PROTEINUR Negative 05/16/2021 1534   NITRITE Negative 05/16/2021 1534   LEUKOCYTESUR 1+ (A) 05/16/2021 1534    Lab Results  Component Value Date   LABMICR See below: 05/16/2021   WBCUA 6-10 (A) 05/16/2021   LABEPIT 0-10 05/16/2021   BACTERIA Moderate (A) 05/16/2021    Pertinent Imaging:  No results found for this or any previous visit.  No results found for this or any previous visit.  No results found for this or any previous visit.  No results found for this or any previous visit.  No results found for this or any previous visit.  No results found for this  or any previous visit.  No results found for this or any previous visit.  Results for orders placed in visit on 05/16/21  CT RENAL STONE STUDY  Narrative CLINICAL DATA:  Flank pain, kidney stone suspected  EXAM: CT ABDOMEN AND PELVIS WITHOUT CONTRAST  TECHNIQUE: Multidetector CT imaging of the abdomen and pelvis was performed following the standard protocol without IV contrast.  RADIATION DOSE REDUCTION: This exam was performed according to the departmental dose-optimization program which includes automated exposure control, adjustment of the mA and/or kV according to patient size and/or use of iterative reconstruction technique.  COMPARISON:  Pelvic ultrasound 07/16/2018  FINDINGS: Lower chest: No acute abnormality.  Hepatobiliary: Normal noncontrast appearance of liver. Peripherally-calcified 0.7 cm gallstone at the gallbladder fundus. No gallbladder wall thickening, or biliary dilatation.  Pancreas: No pancreatic ductal dilatation or surrounding inflammatory changes.  Spleen: Normal in size without focal abnormality.  Adrenals/Urinary Tract: Adrenal glands are unremarkable. Kidneys are normal, without renal calculi, focal lesion, or hydronephrosis. Bladder is unremarkable.  Stomach/Bowel: Stomach is within normal limits. Appendix appears normal. No evidence of bowel wall thickening, distention, or inflammatory changes.  Vascular/Lymphatic: Trace aortic atherosclerosis. No enlarged abdominal or pelvic lymph nodes.  Reproductive: Uterus and bilateral adnexa are unremarkable.  Other: No abdominal wall  hernia or abnormality. No abdominopelvic ascites.  Musculoskeletal: No acute or significant osseous findings.  IMPRESSION: 1. No hydronephrosis or radiodense renal calculus. 2. Cholelithiasis. No additional findings to suggest acute cholecystitis.   Electronically Signed By: Roanna Banning M.D. On: 06/19/2021 19:18  No results found for this or any previous  visit (from the past 24 hour(s)).

## 2021-06-22 NOTE — Patient Instructions (Addendum)
Miralax over the counter as needed for constipation  Gemtesa once daily  STOP Myrbetriq

## 2021-06-23 LAB — URINALYSIS, ROUTINE W REFLEX MICROSCOPIC
Bilirubin, UA: NEGATIVE
Glucose, UA: NEGATIVE
Ketones, UA: NEGATIVE
Nitrite, UA: NEGATIVE
Specific Gravity, UA: 1.02 (ref 1.005–1.030)
Urobilinogen, Ur: 0.2 mg/dL (ref 0.2–1.0)
pH, UA: 7 (ref 5.0–7.5)

## 2021-06-23 LAB — MICROSCOPIC EXAMINATION

## 2021-06-24 ENCOUNTER — Telehealth: Payer: Self-pay

## 2021-06-24 LAB — URINE CULTURE

## 2021-06-24 MED ORDER — AMOXICILLIN-POT CLAVULANATE 875-125 MG PO TABS: 1.0000 | ORAL_TABLET | Freq: Two times a day (BID) | ORAL | 0 refills | Status: DC

## 2021-06-24 NOTE — Telephone Encounter (Signed)
Patient called at both numbers listed with no answer and unable to leave message. Will continue to reach out to pt via language line.

## 2021-06-24 NOTE — Telephone Encounter (Signed)
Patient called with language line interpreter and made aware.

## 2021-06-24 NOTE — Telephone Encounter (Signed)
-----   Message from Sydnee Levans, [place] sent at 06/24/2021  9:42 AM EDT ----- Please let pt know her culture indicates that she needs different antibx. I have sent Augmentin to her pharmacy. Pt does not speak English  ----- Message ----- From: Interface, [facility] Lab Results In Sent: 06/23/2021   5:38 AM EDT To: Sydnee Levans, PA-C

## 2021-07-20 ENCOUNTER — Ambulatory Visit: Payer: 59 | Admitting: Physician Assistant

## 2021-07-20 VITALS — BP 121/82 | HR 81

## 2021-07-20 DIAGNOSIS — N3946 Mixed incontinence: Secondary | ICD-10-CM | POA: Diagnosis not present

## 2021-07-20 DIAGNOSIS — R300 Dysuria: Secondary | ICD-10-CM | POA: Diagnosis not present

## 2021-07-20 DIAGNOSIS — N951 Menopausal and female climacteric states: Secondary | ICD-10-CM | POA: Diagnosis not present

## 2021-07-20 DIAGNOSIS — N3000 Acute cystitis without hematuria: Secondary | ICD-10-CM

## 2021-07-20 LAB — URINALYSIS, ROUTINE W REFLEX MICROSCOPIC
Bilirubin, UA: NEGATIVE
Glucose, UA: NEGATIVE
Ketones, UA: NEGATIVE
Nitrite, UA: NEGATIVE
Protein,UA: NEGATIVE
RBC, UA: NEGATIVE
Specific Gravity, UA: 1.015 (ref 1.005–1.030)
Urobilinogen, Ur: 1 mg/dL (ref 0.2–1.0)
pH, UA: 7.5 (ref 5.0–7.5)

## 2021-07-20 LAB — MICROSCOPIC EXAMINATION
RBC, Urine: NONE SEEN /hpf (ref 0–2)
Renal Epithel, UA: NONE SEEN /hpf

## 2021-07-20 MED ORDER — AMOXICILLIN-POT CLAVULANATE 875-125 MG PO TABS: 1.0000 | ORAL_TABLET | Freq: Two times a day (BID) | ORAL | 0 refills | Status: DC

## 2021-07-20 NOTE — Patient Instructions (Signed)
Miralax para extrenimiento

## 2021-07-20 NOTE — Progress Notes (Unsigned)
Assessment: 1. Burning with urination  2. Acute cystitis without hematuria - Urinalysis, Routine w reflex microscopic - amoxicillin-clavulanate (AUGMENTIN) 875-125 MG tablet; Take 1 tablet by mouth every 12 (twelve) hours.  Dispense: 14 tablet; Refill: 0 - Urine Culture  3. Mixed stress and urge urinary incontinence  4. Vaginal dryness, menopausal    Plan: Urine culture ordered.  Prescription for Augmentin renewed based on previous cultures.  We will adjust therapy if indicated by culture results.  She will continue Gemtesa and Estrace cream.  She is given written and verbal instructions for relief of chronic constipation and MiraLAX information also given in written form.  Follow-up already scheduled for repeat urinalysis and possible cystoscopic exam for additional evaluation of recurrent UTIs.  Chief Complaint: No chief complaint on file.   HPI: Brianna [name] is a 51 y.o. female who presents for evaluation of acute onset urinary urgency, frequency, nocturia, burning.  Symptoms started approximately 2 to 3 weeks ago but worsened significantly over the past several days.  She denies gross hematuria and has had no fever, chills, nausea or vomiting.  No abdominal pain.  She does admit to some low back discomfort. She has been on [place] and Estrace cream since last visit which she states OAB symptoms had improved significantly until onset of her current complaint.. Cx on 06/22/21 grew 100K Beta hemolytic Strep B tx with Augmentin.  Constipation remains an issue, but the patient states she could not remember the medication recommended for this.  She was advised to use MiraLAX nightly. UA = 11-30 WBCs, many bacteria, nitrite negative.  History is obtained through [facility] provided interpreter who was present in the exam room.  06/22/21 Brianna [name] is a 51 y.o. female who presents for continued evaluation of recurrent UTIs and mixed urinary incontinence.  History is  obtained via translation interpreter provided by [facility].  Her symptoms improved minimally on Myrbetriq and estrogen cream as previously prescribed, but symptoms recently worsened with urgency, frequency, nocturia up to 10 times per night and states she is always ways wet.  Flank pain has resolved.  No gross hematuria, burning today.  CT stone study indicated no evidence of urinary stone disease.  Discussed CT findings with the patient and again assured her that a gallstone is not related to the urinary symptoms or the urinary system.  UA = 6-10 WBCs, moderate bacteria, nitrite negative.  Culture on 05/10/2021 was positive for Klebsiella pneumonia, greater than 100,000 colonies, resistant to ampicillin with intermediate resistance to Macrobid.  She continues to have issues with constipation as previously discussed   05/16/21 [facility] interpreter used during pt's OV Brianna [name] is a 51 y.o. year old female who is seen in consultation from Vertis Kelch, NP for evaluation of recurrent UTIs and mixed urinary incontinence.  Her translation from the interpreter, the patient complains of approximately 1 year history of recurrent urinary tract infections with persistent burning, urgency, incontinence, and nocturia.  She also reports persistent right-sided flank pain which has been intermittent over the past year.  Currently, the patient states she wears approximately 3 pads per day due to her urge and stress type incontinence.  She wears no pads at night but reports having to void at least 10 times.  Through translation, she is unable to understand whether this is a significant amount or mounts of urine at night.  She also reports weakness of stream as well as pain with intercourse.  US imaging in the past indicated gallstones, but  patient was under the impression that she had kidney stones.  She denies history of kidney stones in the past but is convinced that she has them now.  She also admits to vaginal  itching.  No current fever, chills, abdominal pain, gross hematuria. She has chronic constipation. No diarrhea. The patient admits to regular soda intake and reports daily baths rather than showers.  The patient is currently on Myrbetriq 50 mg without improvement of her sxs and was prescribed vaginal cream on 05/10/2021 for diagnosis of atrophic vaginitis. Currently on Bactrim from recent dx of UTI Urine cultures: 08/13/2020, mixed urogenital flora, less than 10,000 colonies 5-23, Klebsiella pneumoniae greater than 100,000 colonies, resistant to ampicillin and intermediate resistance to nitrofurantoin   UA=6-10WBC, moderate bacteria, nitrite negative PVR = 1 mL  Portions of the above documentation were copied from a prior visit for review purposes only.  Allergies: No Known Allergies  PMH: Past Medical History:  Diagnosis Date   Ectopic pregnancy    Hypertension    Kidney stones    Vaginal Pap smear, abnormal     PSH: Past Surgical History:  Procedure Laterality Date   EYE SURGERY      SH: Social History   Tobacco Use   Smoking status: Never   Smokeless tobacco: Never  Vaping Use   Vaping Use: Never used  Substance Use Topics   Alcohol use: Not Currently   Drug use: Never    ROS: All other review of systems were reviewed and are negative except what is noted above in HPI  PE: BP 121/82   Pulse 81   LMP 01/20/2015  GENERAL APPEARANCE:  Well appearing, well developed, well nourished, NAD HEENT:  Atraumatic, normocephalic NECK:  Supple. Trachea midline ABDOMEN:  Soft, non-tender, no masses EXTREMITIES:  Moves all extremities well, without clubbing, cyanosis, or edema NEUROLOGIC:  Alert and oriented x 3, normal gait, CN II-XII grossly intact MENTAL STATUS:  appropriate BACK:  Non-tender to palpation, No CVAT SKIN:  Warm, dry, and intact   Results: Laboratory Data:   Urinalysis    Component Value Date/Time   APPEARANCEUR Cloudy (A) 06/22/2021 1433   GLUCOSEU  Negative 06/22/2021 1433   BILIRUBINUR Negative 06/22/2021 1433   PROTEINUR 1+ (A) 06/22/2021 1433   NITRITE Negative 06/22/2021 1433   LEUKOCYTESUR 1+ (A) 06/22/2021 1433    Lab Results  Component Value Date   LABMICR See below: 06/22/2021   WBCUA 6-10 (A) 06/22/2021   LABEPIT 0-10 06/22/2021   BACTERIA Moderate (A) 06/22/2021    Pertinent Imaging: No results found for this or any previous visit.  No results found for this or any previous visit.  No results found for this or any previous visit.  No results found for this or any previous visit.  No results found for this or any previous visit.  No results found for this or any previous visit.  No results found for this or any previous visit.  Results for orders placed in visit on 05/16/21  CT RENAL STONE STUDY  Narrative CLINICAL DATA:  Flank pain, kidney stone suspected  EXAM: CT ABDOMEN AND PELVIS WITHOUT CONTRAST  TECHNIQUE: Multidetector CT imaging of the abdomen and pelvis was performed following the standard protocol without IV contrast.  RADIATION DOSE REDUCTION: This exam was performed according to the departmental dose-optimization program which includes automated exposure control, adjustment of the mA and/or kV according to patient size and/or use of iterative reconstruction technique.  COMPARISON:  Pelvic ultrasound 07/16/2018  FINDINGS:  Lower chest: No acute abnormality.  Hepatobiliary: Normal noncontrast appearance of liver. Peripherally-calcified 0.7 cm gallstone at the gallbladder fundus. No gallbladder wall thickening, or biliary dilatation.  Pancreas: No pancreatic ductal dilatation or surrounding inflammatory changes.  Spleen: Normal in size without focal abnormality.  Adrenals/Urinary Tract: Adrenal glands are unremarkable. Kidneys are normal, without renal calculi, focal lesion, or hydronephrosis. Bladder is unremarkable.  Stomach/Bowel: Stomach is within normal limits. Appendix  appears normal. No evidence of bowel wall thickening, distention, or inflammatory changes.  Vascular/Lymphatic: Trace aortic atherosclerosis. No enlarged abdominal or pelvic lymph nodes.  Reproductive: Uterus and bilateral adnexa are unremarkable.  Other: No abdominal wall hernia or abnormality. No abdominopelvic ascites.  Musculoskeletal: No acute or significant osseous findings.  IMPRESSION: 1. No hydronephrosis or radiodense renal calculus. 2. Cholelithiasis. No additional findings to suggest acute cholecystitis.   Electronically Signed By: Roanna Banning M.D. On: 06/19/2021 19:18  No results found for this or any previous visit (from the past 24 hour(s)).

## 2021-07-22 ENCOUNTER — Other Ambulatory Visit: Payer: Self-pay | Admitting: Physician Assistant

## 2021-07-22 ENCOUNTER — Telehealth: Payer: Self-pay

## 2021-07-22 LAB — URINE CULTURE

## 2021-07-22 MED ORDER — NITROFURANTOIN MONOHYD MACRO 100 MG PO CAPS: 100.0000 mg | ORAL_CAPSULE | Freq: Two times a day (BID) | ORAL | 0 refills | Status: AC

## 2021-07-22 NOTE — Telephone Encounter (Signed)
-----   Message from Sydnee Levans, [place] sent at 07/22/2021  2:09 PM EDT ----- Please let pt know she needs a change in antibx based on culture results. Stop Augmentin and start Macrobid x 7 days. I will send in Rx   ----- Message ----- From: Interface, [facility] Lab Results In Sent: 07/20/2021   4:37 PM EDT To: Sydnee Levans, PA-C

## 2021-07-22 NOTE — Telephone Encounter (Signed)
Tried to call patient with the language line on both numbers on file with no answer and no way to leave a voicemail, will try again at another time.

## 2021-07-22 NOTE — Progress Notes (Signed)
Macrobid sent to pharm. Cx resistant to Augmentin

## 2021-07-25 NOTE — Telephone Encounter (Signed)
Language line was used to informed patient of positive  urine culture result of E.coli bacteria and the PA [place] sent prescription to patient prefer pharmacy [facility]. Patient was concern about if the antibiotic was going to clear the infection. I stated yes that the antibiotic was the the correct course of treatment to clear the E.colic. I informed the patient to start taking the antibiotic now for the next 7 days for the E.colic infection.

## 2021-08-10 ENCOUNTER — Ambulatory Visit: Payer: 59 | Admitting: Urology

## 2021-08-10 ENCOUNTER — Encounter: Payer: Self-pay | Admitting: Urology

## 2021-08-10 ENCOUNTER — Other Ambulatory Visit (HOSPITAL_COMMUNITY): Payer: Self-pay

## 2021-08-10 VITALS — BP 120/75 | HR 84

## 2021-08-10 DIAGNOSIS — Z1231 Encounter for screening mammogram for malignant neoplasm of breast: Secondary | ICD-10-CM

## 2021-08-10 DIAGNOSIS — Z87440 Personal history of urinary (tract) infections: Secondary | ICD-10-CM

## 2021-08-10 DIAGNOSIS — N3000 Acute cystitis without hematuria: Secondary | ICD-10-CM

## 2021-08-10 LAB — URINALYSIS, ROUTINE W REFLEX MICROSCOPIC
Bilirubin, UA: NEGATIVE
Glucose, UA: NEGATIVE
Ketones, UA: NEGATIVE
Leukocytes,UA: NEGATIVE
Nitrite, UA: NEGATIVE
Protein,UA: NEGATIVE
Specific Gravity, UA: 1.01 (ref 1.005–1.030)
Urobilinogen, Ur: 0.2 mg/dL (ref 0.2–1.0)
pH, UA: 6.5 (ref 5.0–7.5)

## 2021-08-10 MED ORDER — SOLIFENACIN SUCCINATE 5 MG PO TABS: 5.0000 mg | ORAL_TABLET | Freq: Every day | ORAL | 3 refills | Status: DC

## 2021-08-10 MED ORDER — CIPROFLOXACIN HCL 500 MG PO TABS
500.0000 mg | ORAL_TABLET | Freq: Once | ORAL | Status: AC
  Administered 2021-08-10: 500 mg via ORAL

## 2021-08-10 MED ORDER — DOXYCYCLINE HYCLATE 100 MG PO CAPS: 100.0000 mg | ORAL_CAPSULE | Freq: Two times a day (BID) | ORAL | 0 refills | Status: DC

## 2021-08-10 NOTE — Progress Notes (Signed)
   08/10/21  CC: recurrent UTI   HPI: Brianna [name] is a 51yo here for followup for recurrent UTI Blood pressure 120/75, pulse 84, last menstrual period 01/20/2015. NED. A&Ox3.   No respiratory distress   Abd soft, NT, ND Normal external genitalia with patent urethral meatus  Cystoscopy Procedure Note  Patient identification was confirmed, informed consent was obtained, and patient was prepped using Betadine solution.  Lidocaine jelly was administered per urethral meatus.    Procedure: - Flexible cystoscope introduced, without any difficulty.   - Thorough search of the bladder revealed:    normal urethral meatus    normal urothelium    no stones    no ulcers     no tumors    no urethral polyps    no trabeculation  - Ureteral orifices were normal in position and appearance.  Post-Procedure: - Patient tolerated the procedure well  Assessment/ Plan: We will start vesicare 5mg  daily. RTC 6 weeks to assess incontinent pad usage Doxycycline 100mg  BID for 7 days   No follow-ups on file.  , MD

## 2021-08-10 NOTE — Patient Instructions (Signed)

## 2021-08-15 ENCOUNTER — Ambulatory Visit (INDEPENDENT_AMBULATORY_CARE_PROVIDER_SITE_OTHER): Payer: 59 | Admitting: Women's Health

## 2021-08-15 ENCOUNTER — Other Ambulatory Visit (HOSPITAL_COMMUNITY)
Admission: RE | Admit: 2021-08-15 | Discharge: 2021-08-15 | Disposition: A | Discharge status: Home / Self Care | Payer: 59 | Source: Ambulatory Visit | Attending: Women's Health | Admitting: Women's Health

## 2021-08-15 ENCOUNTER — Encounter: Payer: Self-pay | Admitting: Women's Health

## 2021-08-15 VITALS — BP 125/72 | HR 78 | Ht 62.0 in | Wt 220.0 lb

## 2021-08-15 DIAGNOSIS — Z1211 Encounter for screening for malignant neoplasm of colon: Secondary | ICD-10-CM

## 2021-08-15 DIAGNOSIS — Z0000 Encounter for general adult medical examination without abnormal findings: Secondary | ICD-10-CM

## 2021-08-15 DIAGNOSIS — Z124 Encounter for screening for malignant neoplasm of cervix: Secondary | ICD-10-CM

## 2021-08-15 DIAGNOSIS — K8080 Other cholelithiasis without obstruction: Secondary | ICD-10-CM

## 2021-08-15 DIAGNOSIS — Z01419 Encounter for gynecological examination (general) (routine) without abnormal findings: Secondary | ICD-10-CM | POA: Diagnosis present

## 2021-08-15 DIAGNOSIS — Z1231 Encounter for screening mammogram for malignant neoplasm of breast: Secondary | ICD-10-CM

## 2021-08-15 NOTE — Progress Notes (Signed)
WELL-WOMAN EXAMINATION Patient name: Brianna [name] MRN [id number]  Date of birth: 13-Dec-1970 Chief Complaint:   Annual Exam  History of Present Illness:   Brianna [name] is a 51 y.o. [id number] Hispanic female being seen today for a routine well-woman exam.  Current complaints: Pain RUQ radiating into Rt upper back, vomits, worse after meals x 3yrs, worse lately. CT for kidney stones on 06/17/21 showed peripherally-calcified 0.7cm gallstone at fundus, no wall thickening or biliary dilatation.   Recurrent UTIs, mixed urinary incontinence managed by Dr. Ronne Binning Some constipation, takes miralax which helps  PCP: [facility]      does not desire labs Patient's last menstrual period was 01/20/2015. The current method of family planning is abstinence and post menopausal status.  Last pap 08/12/20. Results were: LSIL w/ HRHPV positive: other (not 16, 18/45), colpo CIN-I  H/O abnormal pap: yes Last mammogram: 10/29/20. Results were: normal. Family h/o breast cancer: no, has next mammo scheduled for 11/25/21 Last colonoscopy: never. Results were: N/A. Family h/o colorectal cancer: no     08/15/2021    4:07 PM 08/12/2020    3:17 PM  Depression screen PHQ 2/9  Decreased Interest 0 3  Down, Depressed, Hopeless 1 3  PHQ - 2 Score 1 6  Altered sleeping 1 3  Tired, decreased energy 3 3  Change in appetite 0 0  Feeling bad or failure about yourself  1 2  Trouble concentrating 0 2  Moving slowly or fidgety/restless 0 0  Suicidal thoughts 0 0  PHQ-9 Score 6 16        08/15/2021    4:07 PM 08/12/2020    3:18 PM  GAD 7 : Generalized Anxiety Score  Nervous, Anxious, on Edge 1 1  Control/stop worrying 1 3  Worry too much - different things 1 1  Trouble relaxing 2 3  Restless 3 0  Easily annoyed or irritable 2 1  Afraid - awful might happen 1 3  Total GAD 7 Score 11 12     Review of Systems:   Pertinent items are noted in HPI Denies any headaches, blurred vision, fatigue, shortness of  breath, chest pain, abdominal pain, abnormal vaginal discharge/itching/odor/irritation, problems with periods, bowel movements, urination, or intercourse unless otherwise stated above. Pertinent History Reviewed:  Reviewed past medical,surgical, social and family history.  Reviewed problem list, medications and allergies. Physical Assessment:   Vitals:   08/15/21 1510  BP: 125/72  Pulse: 78  Weight: 220 lb (99.8 kg)  Height: 5\' 2"  (1.575 m)  Body mass index is 40.24 kg/m.        Physical Examination:   General appearance - well appearing, and in no distress  Mental status - alert, oriented to person, place, and time  Psych:  She has a normal mood and affect  Skin - warm and dry, normal color, no suspicious lesions noted  Chest - effort normal, all lung fields clear to auscultation bilaterally  Heart - normal rate and regular rhythm  Neck:  midline trachea, no thyromegaly or nodules  Breasts - breasts appear normal, no suspicious masses, no skin or nipple changes or  axillary nodes  Abdomen - soft, nontender, nondistended, no masses or organomegaly  Pelvic - VULVA: normal appearing vulva with no masses, tenderness or lesions  VAGINA: normal appearing vagina with normal color and discharge, no lesions  CERVIX: normal appearing cervix without discharge or lesions, no CMT  Thin prep pap is done w/ HR HPV cotesting  UTERUS: uterus is felt to  be normal size, shape, consistency and nontender   ADNEXA: No adnexal masses or tenderness noted.  Extremities:  No swelling or varicosities noted  Chaperone: Latisha Cresenzo    No results found for this or any previous visit (from the past 24 hour(s)).  Assessment & Plan:  1) Well-Woman Exam  2) Cholelithiasis, symptomatic> 0.7cm stone CT (06/17/21), referral to general surgery, advised low-fat diet  3) Need for colorectal screening> referral to GI placed  4) Mixed urinary incontinence, recurrent UTIs> managed by Dr. Ronne Binning  5) H/O  abnormal pap> repeat today  Labs/procedures today: pap  Mammogram:  11/25/21 as scheduled , or sooner if problems Colonoscopy: schedule screening colonoscopy as soon as possible, GI referral ordered  Orders Placed This Encounter  Procedures   Ambulatory referral to General Surgery   Ambulatory referral to Gastroenterology    Meds: No orders of the defined types were placed in this encounter.   Follow-up: Return in about 1 year (around 08/16/2022) for Physical.  Cheral Marker CNM, [facility] 08/15/2021 4:52 PM

## 2021-08-16 ENCOUNTER — Encounter: Payer: Self-pay | Admitting: *Deleted

## 2021-08-16 ENCOUNTER — Encounter (INDEPENDENT_AMBULATORY_CARE_PROVIDER_SITE_OTHER): Payer: Self-pay | Admitting: *Deleted

## 2021-08-17 ENCOUNTER — Encounter (INDEPENDENT_AMBULATORY_CARE_PROVIDER_SITE_OTHER): Payer: Self-pay | Admitting: *Deleted

## 2021-08-17 ENCOUNTER — Encounter: Payer: Self-pay | Admitting: *Deleted

## 2021-08-18 LAB — CYTOLOGY - PAP
Comment: NEGATIVE
Comment: NEGATIVE
Comment: NEGATIVE
Diagnosis: UNDETERMINED — AB
HPV 16: NEGATIVE
HPV 18 / 45: NEGATIVE
High risk HPV: POSITIVE — AB

## 2021-09-13 ENCOUNTER — Other Ambulatory Visit: Payer: Self-pay | Admitting: *Deleted

## 2021-09-13 ENCOUNTER — Ambulatory Visit: Payer: 59 | Admitting: General Surgery

## 2021-09-13 ENCOUNTER — Encounter: Payer: Self-pay | Admitting: General Surgery

## 2021-09-13 VITALS — BP 136/83 | HR 68 | Temp 98.3°F | Resp 12 | Ht 62.0 in | Wt 216.0 lb

## 2021-09-13 DIAGNOSIS — K8020 Calculus of gallbladder without cholecystitis without obstruction: Secondary | ICD-10-CM

## 2021-09-13 DIAGNOSIS — R1011 Right upper quadrant pain: Secondary | ICD-10-CM

## 2021-09-13 NOTE — Progress Notes (Signed)
[facility] History and Physical  Reason for Referral: Gallstones  Referring Physician: ***  Chief Complaint   New Patient (Initial Visit)     Brianna [name] is a 51 y.o. female.  HPI: Patient coming in with 4 year of abdominal pain, nausea and vomiting for over 1 year. She had a CT renal protocol and this demonstrated gallstones but no signs of cholecystitis. She has issues of constipation.  She says her pain is worsen with spicy foods with peppers. The pain does not occur every time she eats but on occasion. She feels like the pain is getting worse.    Past Medical History:  Diagnosis Date  . Ectopic pregnancy   . Hypertension   . Kidney stones   . Vaginal Pap smear, abnormal     Past Surgical History:  Procedure Laterality Date  . EYE SURGERY      Family History  Problem Relation Age of Onset  . Stroke Father   . Cancer Mother        stomach  . Diabetes Brother   . Hypertension Brother   . Diabetes Sister   . Hypertension Sister     Social History   Tobacco Use  . Smoking status: Never  . Smokeless tobacco: Never  Vaping Use  . Vaping Use: Never used  Substance Use Topics  . Alcohol use: Not Currently  . Drug use: Never    Medications: I have reviewed the patient's current medications. Allergies as of 09/13/2021   No Known Allergies      Medication List        Accurate as of September 13, 2021 10:51 AM. If you have any questions, ask your nurse or doctor.          STOP taking these medications    doxycycline 100 MG capsule Commonly known as: VIBRAMYCIN Stopped by: Lucretia Roers, MD       TAKE these medications    FISH OIL PO Take by mouth.   Gemtesa 75 MG Tabs Generic drug: Vibegron Take 75 mg by mouth daily.   ibuprofen 200 MG tablet Commonly known as: ADVIL Take 400 mg by mouth in the morning and at bedtime.   lisinopril-hydrochlorothiazide 20-12.5 MG tablet Commonly known as: ZESTORETIC Take 1  tablet by mouth daily.   Premarin vaginal cream Generic drug: conjugated estrogens Use 0.5 gm in vagina at bedtime for 2 weeks then 3 x weekly   solifenacin 5 MG tablet Commonly known as: VESICARE Take 1 tablet (5 mg total) by mouth daily.         ROS:  {Review of Systems:30496}  Blood pressure 136/83, pulse 68, temperature 98.3 F (36.8 C), temperature source Oral, resp. rate 12, height 5\' 2"  (1.575 m), weight 216 lb (98 kg), last menstrual period 01/20/2015, SpO2 94 %. Physical Exam  Results: CLINICAL DATA:  Flank pain, kidney stone suspected   EXAM: CT ABDOMEN AND PELVIS WITHOUT CONTRAST   TECHNIQUE: Multidetector CT imaging of the abdomen and pelvis was performed following the standard protocol without IV contrast.   RADIATION DOSE REDUCTION: This exam was performed according to the departmental dose-optimization program which includes automated exposure control, adjustment of the mA and/or kV according to patient size and/or use of iterative reconstruction technique.   COMPARISON:  Pelvic ultrasound 07/16/2018   FINDINGS: Lower chest: No acute abnormality.   Hepatobiliary: Normal noncontrast appearance of liver. Peripherally-calcified 0.7 cm gallstone at the gallbladder fundus. No gallbladder wall thickening, or biliary dilatation.  Pancreas: No pancreatic ductal dilatation or surrounding inflammatory changes.   Spleen: Normal in size without focal abnormality.   Adrenals/Urinary Tract: Adrenal glands are unremarkable. Kidneys are normal, without renal calculi, focal lesion, or hydronephrosis. Bladder is unremarkable.   Stomach/Bowel: Stomach is within normal limits. Appendix appears normal. No evidence of bowel wall thickening, distention, or inflammatory changes.   Vascular/Lymphatic: Trace aortic atherosclerosis. No enlarged abdominal or pelvic lymph nodes.   Reproductive: Uterus and bilateral adnexa are unremarkable.   Other: No abdominal wall  hernia or abnormality. No abdominopelvic ascites.   Musculoskeletal: No acute or significant osseous findings.   IMPRESSION: 1. No hydronephrosis or radiodense renal calculus. 2. Cholelithiasis. No additional findings to suggest acute cholecystitis.     Electronically Signed   By: Roanna Banning M.D.   On: 06/19/2021 19:18   Assessment & Plan:  Brianna [name] is a 51 y.o. female with *** -*** -*** -Follow up ***  All questions were answered to the satisfaction of the patient and family***.  The risk and benefits of *** were discussed including but not limited to ***.  After careful consideration, Brianna [name] has decided to ***.    Lucretia Roers 09/13/2021, 10:51 AM

## 2021-09-13 NOTE — Patient Instructions (Addendum)
Lo derivar a gastroenterologa para posible endoscopia y colonoscopia.  Lo derivar a gastroenterologa para posible endoscopia y colonoscopia. El 11, 13, 15, 6718, 20, 21, 5722 de septiembre son Ameren Corporationtodos das posibles.   Colecistectoma mnimamente invasiva Minimally Invasive Cholecystectomy Una colecistectoma mnimamente invasiva es una ciruga que se realiza para extirpar la vescula biliar. La vescula biliar es un rgano que tiene forma de pera y se encuentra debajo del hgado, del lado derecho del cuerpo. La vescula biliar almacena bilis, un lquido que ayuda al organismo a digerir las grasas. La colecistectoma se realiza con frecuencia para tratar la inflamacin (irritacin e hinchazn) de la vescula biliar (colecistitis). Por lo general, esta afeccin se debe a una acumulacin de clculos biliares (colelitiasis) en la vescula biliar o al estancamiento del lquido de la vescula biliar a causa de que los clculos biliares se atascan en los conductos (tubos) y obstruyen el paso de la bilis. Esto puede producir inflamacin y Engineer, miningdolor. En los Illinois Tool Workscasos graves, podr ser Bangladeshnecesaria una ciruga de urgencia. Este procedimiento se realiza a travs de pequeas incisiones en el abdomen, en lugar de una incisin grande. Tambin se denomina "ciruga laparoscpica". Se introduce un endoscopio delgado que tiene Secretary/administratoruna cmara (laparoscopio) a travs de una incisin. A travs de las otras incisiones, se introducen los instrumentos quirrgicos. En algunos casos, es posible que [place]una ciruga mnimamente invasiva deba cambiarse a una Azerbaijanciruga realizada a travs de una incisin ms grande. Esta se denomina "ciruga abierta". Informe al mdico acerca de lo siguiente: Cualquier alergia que tenga. Todos los Chesapeake Energymedicamentos que [place]a, incluidos vitaminas, hierbas, gotas oftlmicas, cremas y [address]medicamentos de [address] Northventa libre. Problemas previos que usted o algn miembro de su familia hayan tenido con los anestsicos. Cualquier problema de la  sangre que tenga. Cirugas a las que se haya sometido. Cualquier afeccin mdica que tenga. Si est embarazada o podra estarlo. Cules son los riesgos? En general, se trata de un procedimiento seguro. Sin embargo, pueden ocurrir complicaciones, por ejemplo: Infeccin. Sangrado. Reacciones alrgicas a los medicamentos. Daos a las estructuras o los rganos cercanos. Un clculo biliar que queda en el conducto biliar comn. El conducto coldoco transporta la bilis desde la vescula biliar hasta el intestino delgado. Una filtracin de bilis del hgado o del conducto qustico despus de que se extirpa la vescula biliar. Qu ocurre antes del procedimiento? Medicamentos Consulte al mdico si debe hacer o no lo siguiente: Multimedia programmerCambiar o suspender los medicamentos que [place]a habitualmente. Esto es muy importante si toma medicamentos para la diabetes o anticoagulantes. Tomar medicamentos como aspirina e ibuprofeno. Estos medicamentos pueden tener un efecto anticoagulante en la Penn Valleysangre. No tome estos medicamentos a menos que el mdico se lo indique. Usar medicamentos de venta libre, vitaminas, hierbas y suplementos. Instrucciones generales Si va a marcharse a su casa inmediatamente despus del procedimiento, pdale a un adulto responsable que: Lo lleve a su casa desde el hospital o la clnica. No se le permitir conducir. Lo cuide durante el Sempra Energytiempo que le indiquen. No consuma ningn producto que contenga nicotina ni tabaco durante al Lowe's Companiesmenos las 4 semanas anteriores al procedimiento. Estos productos incluyen cigarrillos, tabaco para Theatre managermascar y aparatos de vapeo, como los Administrator, Civil Servicecigarrillos electrnicos. Si necesita ayuda para dejar de fumar, consulte al American Expressmdico. Pregntele al mdico: Cmo se Forensic psychologistmarcar el lugar de la Leisure centre managerciruga. Qu medidas se tomarn para evitar una infeccin. Pueden incluir: Rasurar el vello del lugar de la ciruga. Lavar la piel con un jabn antisptico. Recibir antibiticos. Qu ocurre durante el  procedimiento?  Le colocarn una va intravenosa (i.v.) en una vena. Le administrarn uno de los siguientes medicamentos o ambos: Un medicamento para ayudar a Lexicographer (sedante). Un medicamento que lo har dormir (anestesia general). Su cirujano le har varias incisiones pequeas en el abdomen. El laparoscopio se introducir a travs de una de las pequeas incisiones. La cmara del laparoscopio enviar imgenes a un monitor que se encuentra en el quirfano. Esto permitir a su Pensions consultant del abdomen. Le inyectarn un gas en el abdomen. Esto expandir el abdomen para que el cirujano tenga ms lugar para Facilities manager. El resto del instrumental necesario para el procedimiento se introducir a travs de las otras incisiones. Se extirpar la vescula biliar a travs de una de las incisiones. Se puede examinar el conducto coldoco. Si se encuentran clculos en la va biliar, tal vez deban extirparse. Despus de la extirpacin de la vescula biliar, se cerrarn las incisiones con puntos (suturas), grapas o goma para cerrar la piel. Las incisiones pueden cubrirse con una venda (vendaje). El procedimiento puede variar segn el mdico y el hospital. Ladell Heads ocurre despus del procedimiento? Le controlarn la presin arterial, la frecuencia cardaca, la frecuencia respiratoria y Air cabin crew de oxgeno en la sangre hasta que le den el alta del hospital o la clnica. Le darn analgsicos para Human resources officer, si es necesario. Es posible que le coloquen un drenaje en la incisin. Se lo retirarn [employer] despus del procedimiento. Resumen La colecistectoma mnimamente invasiva, tambin llamada colecistectoma laparoscpica, es una ciruga que se realiza para extirpar la vescula biliar a travs de pequeas incisiones. Informe a su mdico sobre todas las otras afecciones que tenga y [place] todos los medicamentos que est usando para dichas afecciones. Antes del procedimiento, siga las  instrucciones sobre cundo dejar de comer y beber y Tacy Dura cambiar o suspender medicamentos. Haga que un adulto responsable lo cuide durante el tiempo que le indiquen despus de que le den el alta del hospital o de la clnica. Esta informacin no tiene Theme park manager el consejo del mdico. Asegrese de hacerle al mdico cualquier pregunta que tenga.      Cholelithiasis  La colelitiasis es una enfermedad en la que se forman clculos biliares en la vescula biliar. La vescula biliar es un rgano que almacena bilis. La bilis es un lquido que interviene en la digestin de las [place]. Los clculos comienzan como pequeos cristales y lentamente pueden transformarse en piedras. Es posible que no causen sntomas hasta que obstruyen el conducto de la vescula biliar o el conducto qustico, cuando la vescula biliar se tensa (contrae) despus de comer alimentos. Esto puede causar dolor y se conoce como ataque de vescula biliar o clico biliar. Hay dos tipos principales de clculos biliares: Clculos de colesterol. Estos son el tipo de clculo biliar ms frecuente. Estos clculos se forman por el colesterol endurecido y generalmente son de color amarillo verdoso. El colesterol es una sustancia parecida a la grasa que se forma en el hgado. Clculos de pigmento. Estos son de color oscuro y estn formados por una sustancia amarillo rojiza, llamada bilirrubina,que se forma cuando la hemoglobina de los glbulos rojos se descompone. Cules son las causas? La causa de esta afeccin puede ser un desequilibrio en las diferentes partes que producen la bilis. Esto puede suceder si la bilis: Tiene mucha bilirrubina. Esto puede suceder en ciertas enfermedades de la sangre, como la anemia drepanoctica. Tiene mucho colesterol. No tiene suficiente sales biliares. Estas Building services engineer  al organismo a Environmental health practitioner y a Mining engineer. En ciertos casos, esta afeccin tambin puede ser causada por una vescula biliar  que no se vaca completamente o lo suficientemente seguido. Esto es frecuente Academic librarian. Qu incrementa el riesgo? Los siguientes factores pueden hacer que sea ms propenso a Clinical cytogeneticist afeccin: Ser mujer. Tener embarazos mltiples. Algunas veces los mdicos aconsejan extirpar los clculos biliares antes de futuros embarazos. Tener una dieta con demasiadas comidas fritas, grasas y carbohidratos refinados, como el pan blanco y el arroz blanco. Ser obeso. Ser mayor de 40 aos de edad. Usar medicamentos que contienen hormonas femeninas (estrgenos) durante un Psychologist, forensic. Perder peso con rapidez. Antecedentes familiares de clculos biliares. Tener ciertos problemas mdicos, por ejemplo: Diabetes mellitus. Fibrosis qustica. Enfermedad de Crohn. Cirrosis u otra enfermedad heptica a largo plazo (crnica). Determinadas enfermedades de la sangre, como anemia drepanoctica o leucemia. Cules son los signos o sntomas? En muchos casos, tener clculos biliares no causa sntomas. Si tiene clculos biliares pero no tiene sntomas, tiene clculos silenciosos. Si un clculo biliar bloquea el conducto biliar, puede causar un ataque de vescula biliar. El principal sntoma de un ataque de vescula biliar es un dolor repentino en la parte superior derecha del abdomen. El dolor: Aparece generalmente a la noche o despus de comer comidas abundantes. Puede durar Neomia Dear hora o ms. Se puede extender hacia el hombro derecho, la espalda o el pecho. Puede sentirse como indigestin. Esto es [place], ardor o sensacin de plenitud en la parte superior del abdomen. Si el conducto biliar est bloqueado durante ms de algunas horas, puede causar infeccin o inflamacin de la vescula biliar (colecistitis), del hgado o del pncreas. Esto puede causar lo siguiente: Nuseas o vmitos. Distensin. Dolor abdominal que dura 5 horas o ms. Dolor con la palpacin en la parte superior del abdomen, a menudo  en la seccin superior derecha y debajo de la caja torcica. Fiebre o escalofros. La piel o las partes blancas de los ojos se ponen amarillas (ictericia). En general esto ocurre cuando una piedra ha obstruido el paso de la bilis a travs del conducto biliar comn. Orina de color oscuro o heces de color plido. Cmo se diagnostica? Esta afeccin se puede diagnosticar en funcin de lo siguiente: Un examen fsico. Sus antecedentes mdicos. Una ecografa. Exploracin por tomografa computarizada (TC). Resonancia magntica (RM). Tambin pueden hacerle otras pruebas, incluidas las siguientes: Anlisis de sangre para Engineer, manufacturing signos de infeccin o inflamacin. Una colecistografa o gammagrafa hepatobiliar HIDA. Este es un estudio de la vescula biliar y los conductos biliares (sistema biliar) que Nurse, mental health material radiactivo no daino y Therapist, occupational que pueden Naval architect radiactivo. Colangiopancreatografa retrgrada endoscpica. Este Sonic Automotive en insertar un pequeo tubo con una cmara en el extremo (endoscopio) a travs de la boca para observar los conductos biliares y Engineer, manufacturing obstrucciones. Cmo se trata? El tratamiento de esta afeccin depende de la gravedad. Los clculos silenciosos no requieren TEFL teacher. Si una obstruccin causa un ataque de vescula biliar u otros sntomas, puede ser necesario Veterinary surgeon. El tratamiento puede incluir: Cuidados en el hogar, si los sntomas no son graves. Durante un ataque simple de vescula biliar, deje de comer y beber durante 12 a 24 horas (excepto agua y lquidos transparentes). Esto ayuda a "enfriar" la vescula biliar. Despus de 1 o 2 das, puede comenzar a consumir alimentos simples o transparentes, como caldos y Gaffer. Tambin es posible que necesite medicamentos para el dolor o las nuseas, o para ambos.  Si tiene colecistitis y [place] infeccin, Pension scheme manager antibiticos. Hospitalizacin, si es necesaria para controlar del  dolor o en caso de colecistitis con infeccin grave. Una colecistectoma, o ciruga para extirpar la vescula biliar. Este es el tratamiento ms frecuente si todos los dems tratamientos no han funcionado. Medicamentos para destruir los clculos biliares. Estos son ms efectivos para tratar clculos pequeos. Los medicamentos pueden usarse durante hasta 6 a 12 meses. Colangiopancreatografa retrgrada endoscpica. Se puede anexar una pequea cesta al endoscopio y usarse para capturar y eliminar los clculos, especialmente los que se encuentran en el conducto biliar comn. Siga estas instrucciones en su casa: Medicamentos Use los medicamentos de venta libre y los recetados solamente como se lo haya indicado el mdico. Si le recetaron un antibitico, tmelo como se lo haya indicado el mdico. No deje de tomar el antibitico aunque comience a sentirse mejor. Pregntele al mdico si el medicamento recetado le impide conducir o usar [place]. Comida y bebida Beba suficiente lquido como para Pharmacologist la orina de color amarillo plido. Esto es importante durante un ataque de vescula biliar. Es preferible tomar agua y lquidos claros. Siga una dieta saludable. Esto puede comprender lo siguiente: Reducir los [employer], como los alimentos fritos y los alimentos con alto contenido de Oncologist. Reducir los carbohidratos refinados, como el pan blanco y el arroz blanco. Consumir ms [place]. Alimentarse con alimentos como almendras, frutas y frijoles. Consumo de alcohol Si bebe alcohol: Limite la cantidad que bebe: De 0 a 1 medida por da para las mujeres que no estn embarazadas. De 0 a 2 medidas por da para los hombres. Est atento a la cantidad de alcohol que hay en las bebidas que toma. En los [address], una medida equivale a una botella de cerveza de 12 oz (355 ml), un vaso de vino de 5 oz (148 ml) o un vaso de una bebida alcohlica de alta graduacin de 1 oz (44 ml). Instrucciones  generales No consuma ningn producto que contenga nicotina o tabaco, como cigarrillos, cigarrillos electrnicos y tabaco de Theatre manager. Si necesita ayuda para dejar de fumar, consulte al mdico. Mantenga un peso saludable. Concurra a todas las visitas de seguimiento como se lo haya indicado el mdico. Estas pueden incluir consultas con un cirujano o un especialista. Esto es importante. Dnde buscar ms informacin [employer] of Diabetes and Digestive and Kidney Diseases [employer] de la Diabetes y las Enfermedades Digestivas y Renales): [email or web address] Comunquese con un mdico si: Piensa que ha tenido un ataque de vescula biliar. Le han diagnosticado clculos silenciosos y Licensed conveyancer en el abdomen o indigestin. Comienza a tener ataques con ms frecuencia. Orina de color oscuro o tiene heces de color plido. Solicite ayuda de inmediato si: Tiene dolor por un ataque de vescula biliar que dura ms de 2 horas. Tiene dolor en el abdomen que dura ms de 5 horas o est empeorando. Tiene fiebre o escalofros. Tiene nuseas y vmitos que no desaparecen. Tiene ictericia. Resumen La colelitiasis es una enfermedad en la que se forman clculos biliares en la vescula biliar. La causa de esta afeccin puede ser un desequilibrio en las diferentes partes que producen la bilis. Esto puede suceder si la bilis tiene demasiado colesterol o bilirrubina, o contiene cantidad insuficiente de sales biliares. El tratamiento de los clculos biliares depende de la gravedad de la afeccin. Los clculos silenciosos no requieren TEFL teacher. Si los clculos causan un ataque de vescula biliar u otros sntomas, el tratamiento generalmente implica no comer ni beber nada. El  tratamiento tambin puede incluir analgsicos y antibiticos, y a Biomedical scientist. La ciruga para extirpar la vescula biliar es frecuente si todos los dems tratamientos no han funcionado. Esta informacin no tiene Public house manager el consejo del mdico. Asegrese de hacerle al mdico cualquier pregunta que tenga. Document Revised: 01/31/2019 Document Reviewed: 01/31/2019 Elsevier Patient Education  2023 [employer].

## 2021-09-15 ENCOUNTER — Encounter: Payer: Self-pay | Admitting: *Deleted

## 2021-09-19 ENCOUNTER — Ambulatory Visit: Payer: 59 | Admitting: Urology

## 2021-09-19 ENCOUNTER — Encounter: Payer: Self-pay | Admitting: Urology

## 2021-09-19 VITALS — BP 120/78 | HR 89

## 2021-09-19 DIAGNOSIS — R102 Pelvic and perineal pain: Secondary | ICD-10-CM

## 2021-09-19 DIAGNOSIS — R300 Dysuria: Secondary | ICD-10-CM

## 2021-09-19 DIAGNOSIS — R3915 Urgency of urination: Secondary | ICD-10-CM

## 2021-09-19 DIAGNOSIS — N3281 Overactive bladder: Secondary | ICD-10-CM

## 2021-09-19 DIAGNOSIS — N3000 Acute cystitis without hematuria: Secondary | ICD-10-CM

## 2021-09-19 LAB — URINALYSIS, ROUTINE W REFLEX MICROSCOPIC
Bilirubin, UA: NEGATIVE
Glucose, UA: NEGATIVE
Ketones, UA: NEGATIVE
Leukocytes,UA: NEGATIVE
Nitrite, UA: NEGATIVE
Specific Gravity, UA: 1.02 (ref 1.005–1.030)
Urobilinogen, Ur: 1 mg/dL (ref 0.2–1.0)
pH, UA: 6 (ref 5.0–7.5)

## 2021-09-19 LAB — MICROSCOPIC EXAMINATION
Renal Epithel, UA: NONE SEEN /hpf
WBC, UA: NONE SEEN /hpf (ref 0–5)

## 2021-09-19 MED ORDER — SOLIFENACIN SUCCINATE 10 MG PO TABS: 10.0000 mg | ORAL_TABLET | Freq: Every day | ORAL | 11 refills | Status: DC

## 2021-09-19 MED ORDER — DOXYCYCLINE HYCLATE 100 MG PO CAPS: 100.0000 mg | ORAL_CAPSULE | Freq: Two times a day (BID) | ORAL | 0 refills | Status: DC

## 2021-09-19 NOTE — Progress Notes (Signed)
09/19/2021 2:42 PM   Brianna [name] 04-Feb-1970 [id number]  Referring provider: Vertis Kelch, NP [address] [place],  [place] [address]  Followup UTI and OAB   HPI: Brianna [name] is a 51yo here for followup for UTIs and OAB. UA concerning for infection. She has mild dysuria. She has urinary urgency and suprapubic pressure. Vesicare improved her pad usage to 2-3 pads per day.    PMH: Past Medical History:  Diagnosis Date   Ectopic pregnancy    Hypertension    Kidney stones    Vaginal Pap smear, abnormal     Surgical History: Past Surgical History:  Procedure Laterality Date   EYE SURGERY      Home Medications:  Allergies as of 09/19/2021   No Known Allergies      Medication List        Accurate as of September 19, 2021  2:42 PM. If you have any questions, ask your nurse or doctor.          FISH OIL PO Take by mouth.   Gemtesa 75 MG Tabs Generic drug: Vibegron Take 75 mg by mouth daily.   ibuprofen 200 MG tablet Commonly known as: ADVIL Take 400 mg by mouth in the morning and at bedtime.   lisinopril-hydrochlorothiazide 20-12.5 MG tablet Commonly known as: ZESTORETIC Take 1 tablet by mouth daily.   Premarin vaginal cream Generic drug: conjugated estrogens Use 0.5 gm in vagina at bedtime for 2 weeks then 3 x weekly   solifenacin 5 MG tablet Commonly known as: VESICARE Take 1 tablet (5 mg total) by mouth daily.        Allergies: No Known Allergies  Family History: Family History  Problem Relation Age of Onset   Stroke Father    Cancer Mother        stomach   Diabetes Brother    Hypertension Brother    Diabetes Sister    Hypertension Sister     Social History:  reports that she has never smoked. She has never used smokeless tobacco. She reports that she does not currently use alcohol. She reports that she does not use drugs.  ROS: All other review of systems were reviewed and are negative except what is noted above in  HPI  Physical Exam: BP 120/78   Pulse 89   LMP 01/20/2015   Constitutional:  Alert and oriented, No acute distress. HEENT:  AT, moist mucus membranes.  Trachea midline, no masses. Cardiovascular: No clubbing, cyanosis, or edema. Respiratory: Normal respiratory effort, no increased work of breathing. GI: Abdomen is soft, nontender, nondistended, no abdominal masses GU: No CVA tenderness.  Lymph: No cervical or inguinal lymphadenopathy. Skin: No rashes, bruises or suspicious lesions. Neurologic: Grossly intact, no focal deficits, moving all 4 extremities. Psychiatric: Normal mood and affect.  Laboratory Data: No results found for: "WBC", "HGB", "HCT", "MCV", "PLT"  No results found for: "CREATININE"  No results found for: "PSA"  No results found for: "TESTOSTERONE"  No results found for: "HGBA1C"  Urinalysis    Component Value Date/Time   APPEARANCEUR Clear 08/10/2021 1549   GLUCOSEU Negative 08/10/2021 1549   BILIRUBINUR Negative 08/10/2021 1549   PROTEINUR Negative 08/10/2021 1549   NITRITE Negative 08/10/2021 1549   LEUKOCYTESUR Negative 08/10/2021 1549    Lab Results  Component Value Date   LABMICR Comment 08/10/2021   WBCUA 11-30 (A) 07/20/2021   LABEPIT 0-10 07/20/2021   MUCUS Present 07/20/2021   BACTERIA Many (A) 07/20/2021    Pertinent  Imaging:  No results found for this or any previous visit.  No results found for this or any previous visit.  No results found for this or any previous visit.  No results found for this or any previous visit.  No results found for this or any previous visit.  No results found for this or any previous visit.  No results found for this or any previous visit.  Results for orders placed in visit on 05/16/21  CT RENAL STONE STUDY  Narrative CLINICAL DATA:  Flank pain, kidney stone suspected  EXAM: CT ABDOMEN AND PELVIS WITHOUT CONTRAST  TECHNIQUE: Multidetector CT imaging of the abdomen and pelvis was  performed following the standard protocol without IV contrast.  RADIATION DOSE REDUCTION: This exam was performed according to the departmental dose-optimization program which includes automated exposure control, adjustment of the mA and/or kV according to patient size and/or use of iterative reconstruction technique.  COMPARISON:  Pelvic ultrasound 07/16/2018  FINDINGS: Lower chest: No acute abnormality.  Hepatobiliary: Normal noncontrast appearance of liver. Peripherally-calcified 0.7 cm gallstone at the gallbladder fundus. No gallbladder wall thickening, or biliary dilatation.  Pancreas: No pancreatic ductal dilatation or surrounding inflammatory changes.  Spleen: Normal in size without focal abnormality.  Adrenals/Urinary Tract: Adrenal glands are unremarkable. Kidneys are normal, without renal calculi, focal lesion, or hydronephrosis. Bladder is unremarkable.  Stomach/Bowel: Stomach is within normal limits. Appendix appears normal. No evidence of bowel wall thickening, distention, or inflammatory changes.  Vascular/Lymphatic: Trace aortic atherosclerosis. No enlarged abdominal or pelvic lymph nodes.  Reproductive: Uterus and bilateral adnexa are unremarkable.  Other: No abdominal wall hernia or abnormality. No abdominopelvic ascites.  Musculoskeletal: No acute or significant osseous findings.  IMPRESSION: 1. No hydronephrosis or radiodense renal calculus. 2. Cholelithiasis. No additional findings to suggest acute cholecystitis.   Electronically Signed By: Roanna Banning M.D. On: 06/19/2021 19:18   Assessment & Plan:    1. Acute cystitis without hematuria -Urine for culture -doxycycline 100mg  BID for 7 days - Urinalysis, Routine w reflex microscopic  2. OAB (overactive bladder) -Increase vesicare to 10mg  daily   No follow-ups on file.  , MD  [facility] [place]

## 2021-09-19 NOTE — Patient Instructions (Signed)

## 2021-09-22 LAB — URINE CULTURE

## 2021-09-26 NOTE — Progress Notes (Deleted)
GI Office Note    Referring Provider: Lucretia Roers, MD Primary Care Physician:  Vertis Kelch, NP  Primary Gastroenterologist: Dr. Jena Gauss  Chief Complaint   No chief complaint on file.   History of Present Illness   Brianna [name] is a 51 y.o. female presenting today at the request of Lucretia Roers, MD for ***upper abdominal pain and constipation.  Recently seen by Dr. Henreitta Leber with general surgery on 09/13/2021.  Patient reports 4-year history of abdominal pain and self-induced vomiting to help her with pain.  Reports full abdomen with bloating.  Reports pain usually worse with spicy foods and peppers the pain does not occur every time she eats but also on occasion.  Recently, the pain is getting worse.  Reports she has never had endoscopy or colonoscopy.  Cholecystectomy discussed with patient at this time.  Recommended GI consultation for possible EGD and colonoscopy as she is overdue for this.  CT renal stone study performed 06/17/2021 with 0.7 cm gallstone in the gallbladder fundus, no gallbladder wall thickening or biliary dilation.   Today: Interpreter present for visit***     Current Outpatient Medications  Medication Sig Dispense Refill   conjugated estrogens (PREMARIN) vaginal cream Use 0.5 gm in vagina at bedtime for 2 weeks then 3 x weekly 42.5 g 1   doxycycline (VIBRAMYCIN) 100 MG capsule Take 1 capsule (100 mg total) by mouth every 12 (twelve) hours. 14 capsule 0   ibuprofen (ADVIL) 200 MG tablet Take 400 mg by mouth in the morning and at bedtime.     lisinopril-hydrochlorothiazide (ZESTORETIC) 20-12.5 MG tablet Take 1 tablet by mouth daily.     Omega-3 Fatty Acids (FISH OIL PO) Take by mouth.     solifenacin (VESICARE) 10 MG tablet Take 1 tablet (10 mg total) by mouth daily. 30 tablet 11   Vibegron (GEMTESA) 75 MG TABS Take 75 mg by mouth daily. (Patient not taking: Reported on 08/10/2021) 28 tablet 0   No current facility-administered medications for  this visit.    Past Medical History:  Diagnosis Date   Ectopic pregnancy    Hypertension    Kidney stones    Vaginal Pap smear, abnormal     Past Surgical History:  Procedure Laterality Date   EYE SURGERY      Family History  Problem Relation Age of Onset   Stroke Father    Cancer Mother        stomach   Diabetes Brother    Hypertension Brother    Diabetes Sister    Hypertension Sister     Allergies as of 09/27/2021   (No Known Allergies)    Social History   Socioeconomic History   Marital status: Single    Spouse name: Not on file   Number of children: Not on file   Years of education: Not on file   Highest education level: Not on file  Occupational History   Not on file  Tobacco Use   Smoking status: Never   Smokeless tobacco: Never  Vaping Use   Vaping Use: Never used  Substance and Sexual Activity   Alcohol use: Not Currently   Drug use: Never   Sexual activity: Not Currently    Birth control/protection: Post-menopausal  Other Topics Concern   Not on file  Social History Narrative   Not on file   Social Determinants of Health   Financial Resource Strain: Medium Risk (08/15/2021)   Overall Financial Resource Strain (CARDIA)  Difficulty of Paying Living Expenses: Somewhat hard  Food Insecurity: Food Insecurity Present (08/15/2021)   Hunger Vital Sign    Worried About Running Out of Food in the Last Year: Sometimes true    Ran Out of Food in the Last Year: Sometimes true  Transportation Needs: Unmet Transportation Needs (08/15/2021)   PRAPARE - Transportation    Lack of Transportation (Medical): No    Lack of Transportation (Non-Medical): Yes  Physical Activity: Inactive (08/15/2021)   Exercise Vital Sign    Days of Exercise per Week: 0 days    Minutes of Exercise per Session: 0 min  Stress: No Stress Concern Present (08/15/2021)   [place]    Feeling of Stress : Only a little   Social Connections: Moderately Integrated (08/15/2021)   Social Connection and Isolation Panel [NHANES]    Frequency of Communication with Friends and Family: More than three times a week    Frequency of Social Gatherings with Friends and Family: Three times a week    Attends Religious Services: More than 4 times per year    Active Member of Clubs or Organizations: Yes    Attends Archivist Meetings: Never    Marital Status: Separated  Intimate Partner Violence: Not At Risk (08/15/2021)   Humiliation, Afraid, Rape, and Kick questionnaire    Fear of Current or Ex-Partner: No    Emotionally Abused: No    Physically Abused: No    Sexually Abused: No     Review of Systems   Gen: Denies any fever, chills, fatigue, weight loss, lack of appetite.  CV: Denies chest pain, heart palpitations, peripheral edema, syncope.  Resp: Denies shortness of breath at rest or with exertion. Denies wheezing or cough.  GI: see HPI GU : Denies urinary burning, urinary frequency, urinary hesitancy MS: Denies joint pain, muscle weakness, cramps, or limitation of movement.  Derm: Denies rash, itching, dry skin Psych: Denies depression, anxiety, memory loss, and confusion Heme: Denies bruising, bleeding, and enlarged lymph nodes.   Physical Exam   LMP 01/20/2015   General:   Alert and oriented. Pleasant and cooperative. Well-nourished and well-developed.  Head:  Normocephalic and atraumatic. Eyes:  Without icterus, sclera clear and conjunctiva pink.  Ears:  Normal auditory acuity. Mouth:  No deformity or lesions, oral mucosa pink.  Lungs:  Clear to auscultation bilaterally. No wheezes, rales, or rhonchi. No distress.  Heart:  S1, S2 present without murmurs appreciated.  Abdomen:  +BS, soft, non-tender and non-distended. No HSM noted. No guarding or rebound. No masses appreciated.  Rectal:  Deferred  Msk:  Symmetrical without gross deformities. Normal posture. Extremities:  Without  edema. Neurologic:  Alert and  oriented x4;  grossly normal neurologically. Skin:  Intact without significant lesions or rashes. Psych:  Alert and cooperative. Normal mood and affect.   Assessment   Brianna [name] is a 51 y.o. female with a history of ectopic pregnancy, HTN, kidney stones, and chronic abdominal pain*** presenting today with upper abdominal pain and constipation.  Abdominal pain:  Recent CT renal stone study in June 2023 with presence of gallstones and no other intrapelvic or intra-abdominal abnormality.  Constipation:    PLAN   *** Proceed with upper endoscopy and colonoscopy with propofol by Dr. Gala Romney in near future: the risks, benefits, and alternatives have been discussed with the patient in detail. The patient states understanding and desires to proceed.  ASA 2/3***    Venetia Night, MSN, FNP-BC,  AGACNP-BC [facility] 

## 2021-09-27 ENCOUNTER — Encounter: Payer: Self-pay | Admitting: Gastroenterology

## 2021-09-27 ENCOUNTER — Ambulatory Visit: Payer: 59 | Admitting: Gastroenterology

## 2021-11-02 ENCOUNTER — Ambulatory Visit (HOSPITAL_COMMUNITY): Payer: 59

## 2021-11-25 ENCOUNTER — Ambulatory Visit (HOSPITAL_COMMUNITY): Admission: RE | Admit: 2021-11-25 | Payer: 59 | Source: Ambulatory Visit

## 2021-11-25 ENCOUNTER — Other Ambulatory Visit: Payer: Self-pay

## 2021-11-25 ENCOUNTER — Inpatient Hospital Stay: Payer: Self-pay | Attending: Obstetrics and Gynecology | Admitting: Hematology and Oncology

## 2021-11-25 ENCOUNTER — Inpatient Hospital Stay (HOSPITAL_COMMUNITY): Payer: Self-pay | Attending: Obstetrics and Gynecology

## 2021-11-25 VITALS — BP 140/72 | Wt 216.1 lb

## 2021-11-25 DIAGNOSIS — Z1211 Encounter for screening for malignant neoplasm of colon: Secondary | ICD-10-CM

## 2021-11-25 DIAGNOSIS — Z1231 Encounter for screening mammogram for malignant neoplasm of breast: Secondary | ICD-10-CM

## 2021-11-25 NOTE — Progress Notes (Signed)
Ms. Brianna Brianna [name] is Brianna [name] 51 y.o. female who presents to [facility] clinic today with no complaints.    Pap Smear: Pap not smear completed today. Last Pap smear was 08/15/21 at [facility] and was abnormal - ASCUS/HPV+ . Per patient has no history of an abnormal Pap smear. Last Pap smear result is available in Epic.    Physical exam: Breasts Breasts symmetrical. No skin abnormalities bilateral breasts. No nipple retraction bilateral breasts. No nipple discharge bilateral breasts. No lymphadenopathy. No lumps palpated bilateral breasts.    MS DIGITAL SCREENING TOMO BILATERAL  Result Date: 11/03/2020 CLINICAL DATA:  Screening. EXAM: DIGITAL SCREENING BILATERAL MAMMOGRAM WITH TOMOSYNTHESIS AND CAD TECHNIQUE: Bilateral screening digital craniocaudal and mediolateral oblique mammograms were obtained. Bilateral screening digital breast tomosynthesis was performed. The images were evaluated with computer-aided detection. COMPARISON:  Previous exam(s). ACR Breast Density Category b: There are scattered areas of fibroglandular density. FINDINGS: There are no findings suspicious for malignancy. IMPRESSION: No mammographic evidence of malignancy. Brianna [name] result letter of this screening mammogram will be mailed directly to the patient. RECOMMENDATION: Screening mammogram in one year. (Code:SM-B-01Y) BI-RADS CATEGORY  1: Negative. Electronically Signed   By: Sherian Rein M.D.   On: 11/03/2020 05:18       Pelvic/Bimanual Pap is not indicated today    Smoking History: Patient has never smoked and was not referred to quit line.    Patient Navigation: Patient education provided. Access to services provided for patient through B[facility]CP program. Brianna Brianna [name] interpreter provided. No transportation provided   Colorectal Cancer Screening: Per patient has never had colonoscopy completed No complaints today. FIT test given   Breast and Cervical Cancer Risk Assessment: Patient does not have family history of  breast cancer, known genetic mutations, or radiation treatment to the chest before age 68. Patient has history of cervical dysplasia, immunocompromised, or DES exposure in-utero.  Risk Scores as of 11/25/2021     Brianna Brianna [name]           5-year 0.83 %   Lifetime 6.91 %   This patient is [place] but has no documented birth country, so the [place] model used data from [place] patients to calculate their risk score. Document Brianna [name] birth country in the Demographics activity for Brianna [name].         Last calculated by Brianna Rutherford, Brianna Brianna [name] on 16/10/9602 at 11:13 AM        Brianna [name] No complaint with benign exam.   P: Referred [facility] to the [facility] Brianna [name]. Appointment scheduled 11/25/21.  Brianna Basset A, NP 11/25/2021 11:22 AM

## 2021-11-25 NOTE — Patient Instructions (Signed)
Taught [name] about self breast awareness. Patient did not need a Pap smear today due to last Pap smear was in 2023 per patient. These results were abnormal and she is following with [name] for further treatment planning. Let her know B[facility]CP will cover Pap smears every 3 years unless has a history of abnormal Pap smears. Referred patient to the [facility] for diagnostic mammogram. Appointment scheduled for 11/25/21. Patient aware of appointment and will be there. Let patient know will follow up with her within the next couple weeks with results. Brianna [name] verbalized understanding.  Pascal Lux, NP 11:27 AM

## 2021-12-19 ENCOUNTER — Ambulatory Visit: Payer: 59 | Admitting: Urology

## 2021-12-19 DIAGNOSIS — N3000 Acute cystitis without hematuria: Secondary | ICD-10-CM

## 2022-02-27 ENCOUNTER — Ambulatory Visit (HOSPITAL_COMMUNITY)
Admission: RE | Admit: 2022-02-27 | Discharge: 2022-02-27 | Disposition: A | Discharge status: Home / Self Care | Payer: Self-pay | Source: Ambulatory Visit | Attending: Obstetrics and Gynecology | Admitting: Obstetrics and Gynecology

## 2022-02-27 DIAGNOSIS — Z1231 Encounter for screening mammogram for malignant neoplasm of breast: Secondary | ICD-10-CM | POA: Insufficient documentation

## 2022-05-15 ENCOUNTER — Other Ambulatory Visit: Payer: Self-pay

## 2022-05-15 ENCOUNTER — Other Ambulatory Visit: Payer: 59

## 2022-05-15 DIAGNOSIS — R300 Dysuria: Secondary | ICD-10-CM

## 2022-05-15 LAB — URINALYSIS, ROUTINE W REFLEX MICROSCOPIC
Bilirubin, UA: NEGATIVE
Glucose, UA: NEGATIVE
Ketones, UA: NEGATIVE
Nitrite, UA: NEGATIVE
Protein,UA: NEGATIVE
RBC, UA: NEGATIVE
Specific Gravity, UA: 1.02 (ref 1.005–1.030)
Urobilinogen, Ur: 0.2 mg/dL (ref 0.2–1.0)
pH, UA: 7 (ref 5.0–7.5)

## 2022-05-15 LAB — MICROSCOPIC EXAMINATION

## 2022-05-17 ENCOUNTER — Telehealth: Payer: Self-pay

## 2022-05-17 LAB — URINE CULTURE

## 2022-05-17 MED ORDER — SULFAMETHOXAZOLE-TRIMETHOPRIM 800-160 MG PO TABS: 1.0000 | ORAL_TABLET | Freq: Two times a day (BID) | ORAL | 0 refills | Status: DC

## 2022-05-17 NOTE — Telephone Encounter (Signed)
Patient called and made aware of positive urine culture and antibiotic sent to pharmacy. 

## 2022-10-13 ENCOUNTER — Telehealth: Payer: Self-pay

## 2022-10-13 NOTE — Telephone Encounter (Signed)
Attempted wellness call to [facility] dual enrolled client from [facility]. Noted [employer], confirmed by [employer] no longer eligible for [employer]. Next appt showing for [facility] is 10/17/22 Interpreter services used today, no answer and unable to leave vm today.  [facility] expired 09/27/22 renewal letter was mailed on 07/11/22  Francee Nodal RN Clara Gunn/[facility]

## 2022-10-21 IMAGING — MG MM DIGITAL SCREENING BILAT W/ TOMO AND CAD
6 of 10 series · 6 of 30 positions shown · non-contrast
Comparison: Previous exam(s).

CLINICAL DATA: Screening.

EXAM:
DIGITAL SCREENING BILATERAL MAMMOGRAM WITH TOMOSYNTHESIS AND CAD
TECHNIQUE: Bilateral screening digital craniocaudal and mediolateral oblique
mammograms were obtained. Bilateral screening digital breast
tomosynthesis was performed. The images were evaluated with
computer-aided detection.

[L MLO synth-2D (1 of 2)]
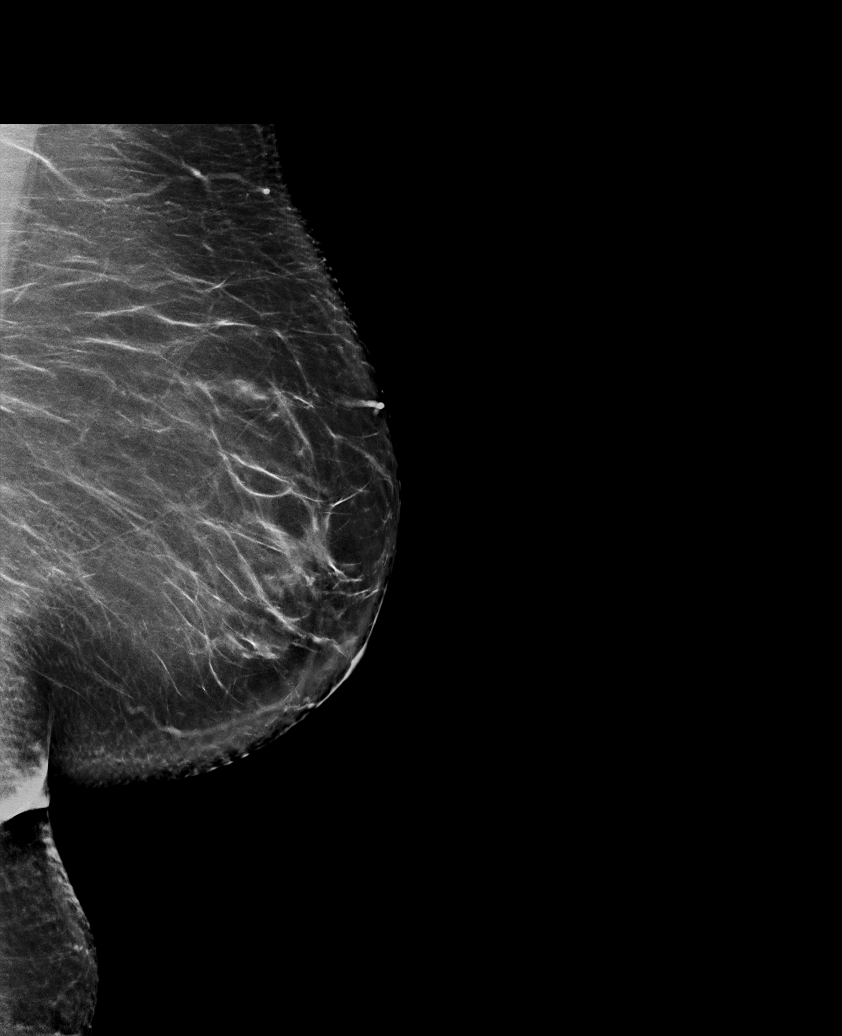

[R CC synth-2D]
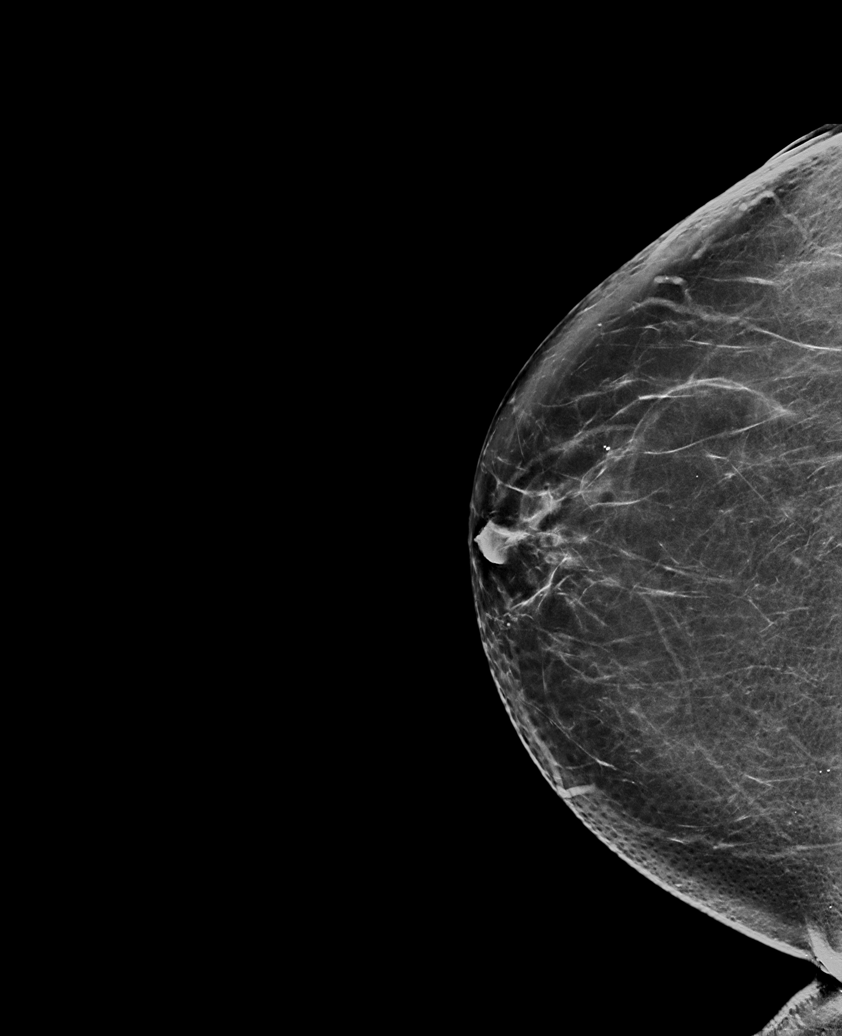

[L MLO synth-2D (2 of 2)]
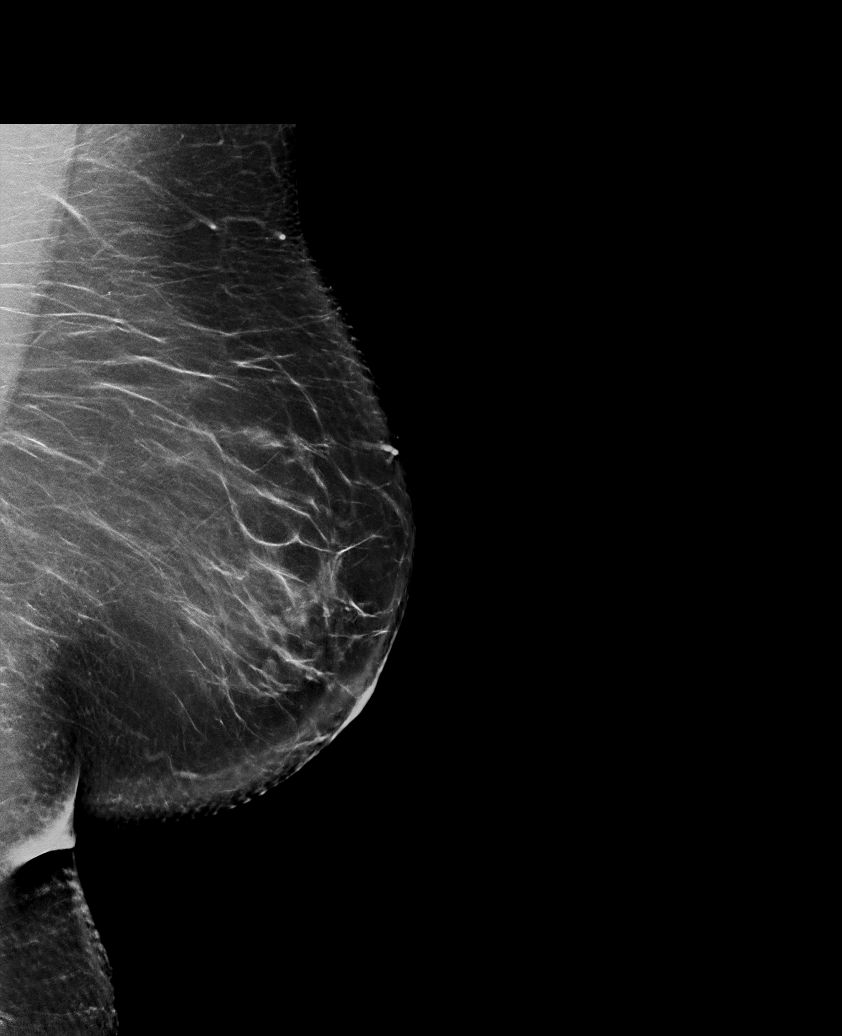

[R MLO synth-2D]
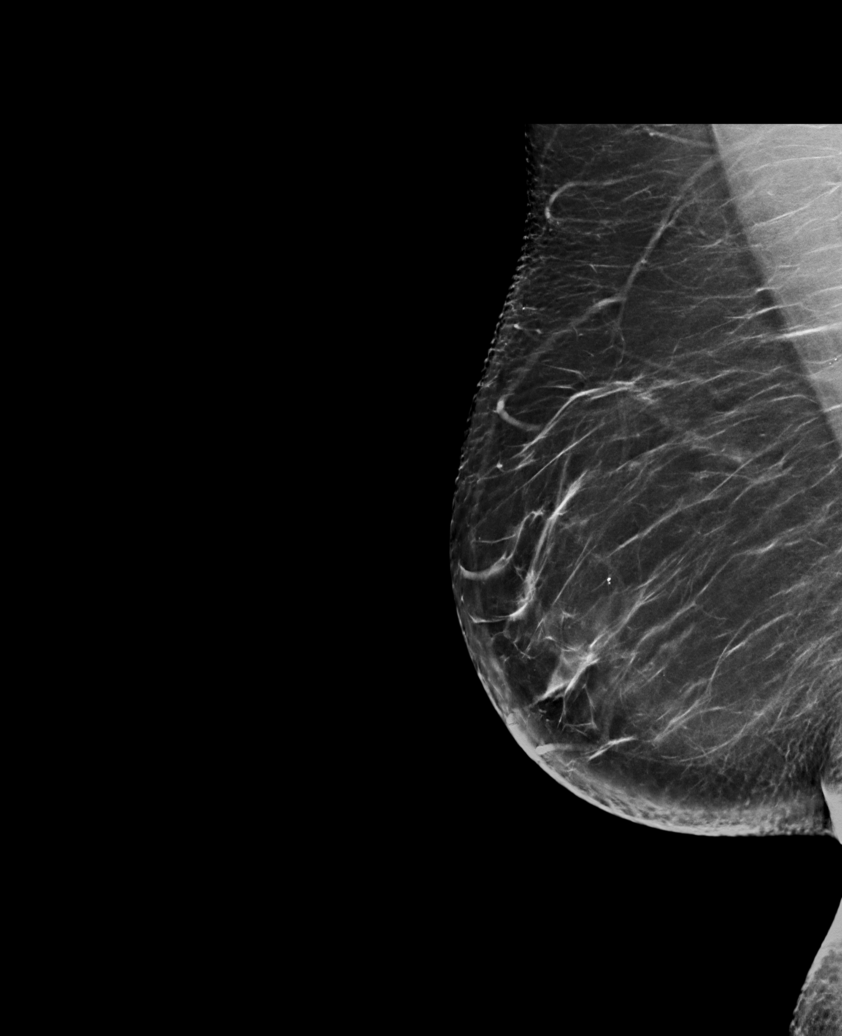

[L CC synth-2D]
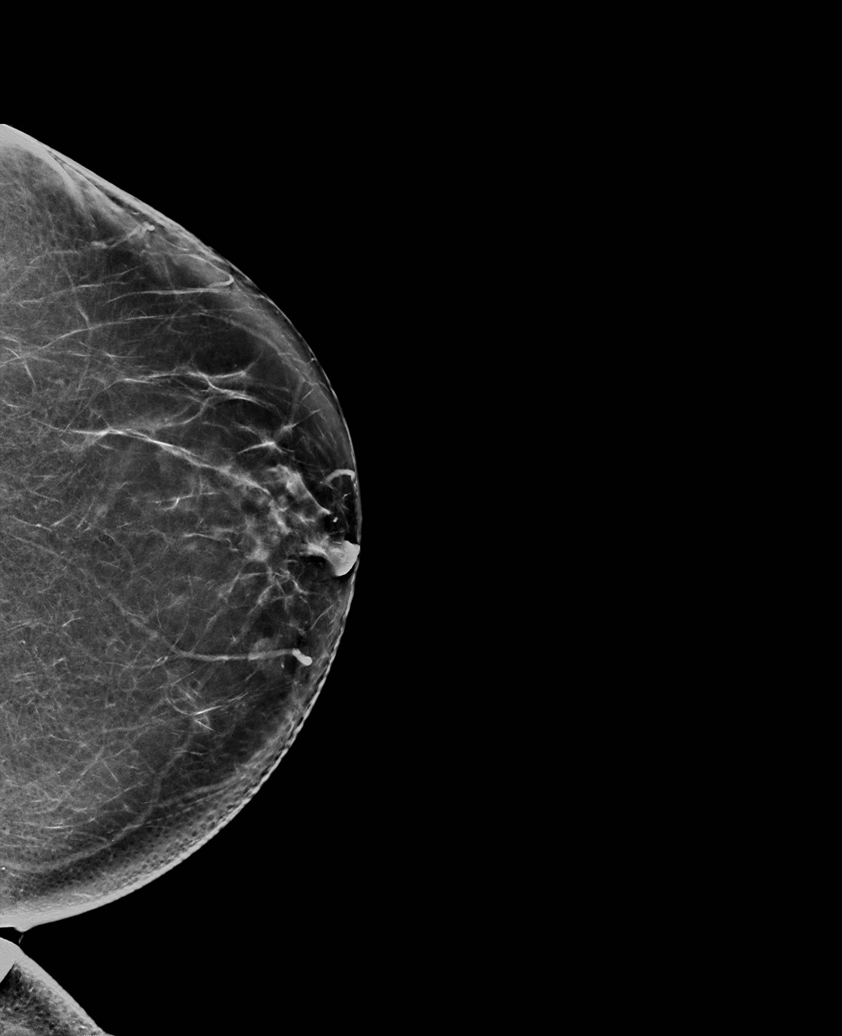

[L CC tomo · tomo slice 43/85.0]
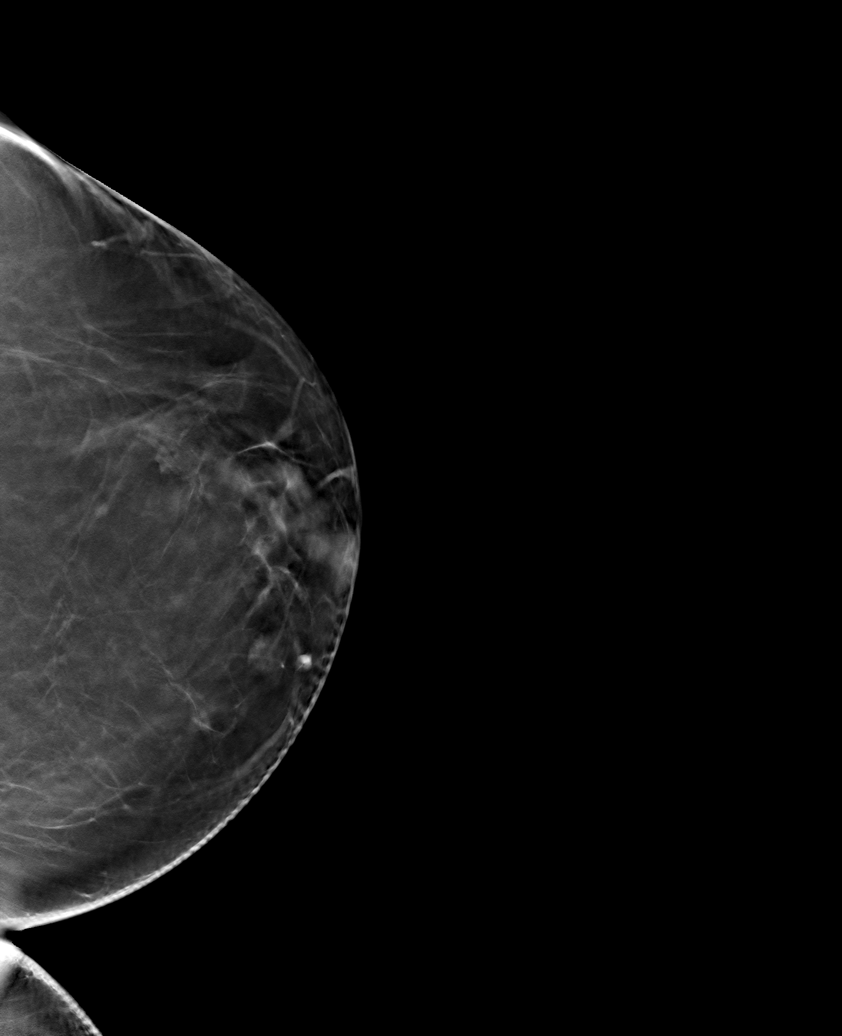

[6 of 30 positions shown; findings below may reference images not displayed]

ACR Breast Density Category b: There are scattered areas of
fibroglandular density.
FINDINGS: There are no findings suspicious for malignancy.
IMPRESSION: No mammographic evidence of malignancy. A result letter of this
screening mammogram will be mailed directly to the patient.

RECOMMENDATION:
Screening mammogram in one year. (Code:51-O-LD2)

BI-RADS CATEGORY  1: Negative.

## 2023-02-28 ENCOUNTER — Ambulatory Visit (INDEPENDENT_AMBULATORY_CARE_PROVIDER_SITE_OTHER): Payer: No Typology Code available for payment source | Admitting: Urology

## 2023-02-28 VITALS — BP 122/79 | HR 89

## 2023-02-28 DIAGNOSIS — R300 Dysuria: Secondary | ICD-10-CM | POA: Diagnosis not present

## 2023-02-28 DIAGNOSIS — N3281 Overactive bladder: Secondary | ICD-10-CM | POA: Diagnosis not present

## 2023-02-28 LAB — URINALYSIS, ROUTINE W REFLEX MICROSCOPIC
Bilirubin, UA: NEGATIVE
Glucose, UA: NEGATIVE
Ketones, UA: NEGATIVE
Nitrite, UA: NEGATIVE
Protein,UA: NEGATIVE
Specific Gravity, UA: 1.015 (ref 1.005–1.030)
Urobilinogen, Ur: 2 mg/dL — ABNORMAL HIGH (ref 0.2–1.0)
pH, UA: 7 (ref 5.0–7.5)

## 2023-02-28 LAB — MICROSCOPIC EXAMINATION: Epithelial Cells (non renal): >10 /[HPF] — AB (ref 0–10)

## 2023-02-28 LAB — BLADDER SCAN AMB NON-IMAGING: Scan Result: 0

## 2023-02-28 MED ORDER — SOLIFENACIN SUCCINATE 10 MG PO TABS: 10.0000 mg | ORAL_TABLET | Freq: Every day | ORAL | 11 refills | Status: DC

## 2023-02-28 MED ORDER — DOXYCYCLINE HYCLATE 100 MG PO CAPS: 100.0000 mg | ORAL_CAPSULE | Freq: Two times a day (BID) | ORAL | 0 refills | Status: DC

## 2023-02-28 NOTE — Progress Notes (Signed)
02/28/2023 11:28 AM   [name] 1970-05-09 [id number]  Referring provider: Vertis Kelch, NP [address] [place],  [place] [address]  dysuria   HPI: Brianna [name] is a 52yo here for followup for frequent UTI and OAB. She has been having dysuria and a strong urine smell for 3 months. She has not been treated for a in the past 3 months. She was treated for a UTI 6 months ago. She is having worsening urinary urgency and frequency since she stopped the vesicare.    PMH: Past Medical History:  Diagnosis Date   Ectopic pregnancy    Hypertension    Kidney stones    Vaginal Pap smear, abnormal     Surgical History: Past Surgical History:  Procedure Laterality Date   EYE SURGERY      Home Medications:  Allergies as of 02/28/2023   No Known Allergies      Medication List        Accurate as of February 28, 2023 11:28 AM. If you have any questions, ask your nurse or doctor.          STOP taking these medications    doxycycline 100 MG capsule Commonly known as: VIBRAMYCIN   Gemtesa 75 MG Tabs Generic drug: Vibegron   Premarin vaginal cream Generic drug: conjugated estrogens   solifenacin 10 MG tablet Commonly known as: VESICARE   sulfamethoxazole-trimethoprim 800-160 MG tablet Commonly known as: BACTRIM DS       TAKE these medications    FISH OIL PO Take by mouth.   ibuprofen 200 MG tablet Commonly known as: ADVIL Take 400 mg by mouth in the morning and at bedtime.   lisinopril-hydrochlorothiazide 20-12.5 MG tablet Commonly known as: ZESTORETIC Take 1 tablet by mouth daily.        Allergies: No Known Allergies  Family History: Family History  Problem Relation Age of Onset   Stroke Father    Cancer Mother        stomach   Diabetes Brother    Hypertension Brother    Diabetes Sister    Hypertension Sister     Social History:  reports that she has never smoked. She has never used smokeless tobacco. She reports that she  does not currently use alcohol. She reports that she does not use drugs.  ROS: All other review of systems were reviewed and are negative except what is noted above in HPI  Physical Exam: BP 122/79   Pulse 89   LMP 01/20/2015   Constitutional:  Alert and oriented, No acute distress. HEENT: Brush AT, moist mucus membranes.  Trachea midline, no masses. Cardiovascular: No clubbing, cyanosis, or edema. Respiratory: Normal respiratory effort, no increased work of breathing. GI: Abdomen is soft, nontender, nondistended, no abdominal masses GU: No CVA tenderness.  Lymph: No cervical or inguinal lymphadenopathy. Skin: No rashes, bruises or suspicious lesions. Neurologic: Grossly intact, no focal deficits, moving all 4 extremities. Psychiatric: Normal mood and affect.  Laboratory Data: No results found for: "WBC", "HGB", "HCT", "MCV", "PLT"  No results found for: "CREATININE"  No results found for: "PSA"  No results found for: "TESTOSTERONE"  No results found for: "HGBA1C"  Urinalysis    Component Value Date/Time   APPEARANCEUR Clear 05/15/2022 1328   GLUCOSEU Negative 05/15/2022 1328   BILIRUBINUR Negative 05/15/2022 1328   PROTEINUR Negative 05/15/2022 1328   NITRITE Negative 05/15/2022 1328   LEUKOCYTESUR 1+ (A) 05/15/2022 1328    Lab Results  Component Value Date  LABMICR See below: 05/15/2022   WBCUA 6-10 (A) 05/15/2022   LABEPIT 0-10 05/15/2022   MUCUS Present 09/19/2021   BACTERIA Moderate (A) 05/15/2022    Pertinent Imaging:  No results found for this or any previous visit.  No results found for this or any previous visit.  No results found for this or any previous visit.  No results found for this or any previous visit.  No results found for this or any previous visit.  No results found for this or any previous visit.  No results found for this or any previous visit.  Results for orders placed in visit on 05/16/21  CT RENAL STONE  STUDY  Narrative CLINICAL DATA:  Flank pain, kidney stone suspected  EXAM: CT ABDOMEN AND PELVIS WITHOUT CONTRAST  TECHNIQUE: Multidetector CT imaging of the abdomen and pelvis was performed following the standard protocol without IV contrast.  RADIATION DOSE REDUCTION: This exam was performed according to the departmental dose-optimization program which includes automated exposure control, adjustment of the mA and/or kV according to patient size and/or use of iterative reconstruction technique.  COMPARISON:  Pelvic ultrasound 07/16/2018  FINDINGS: Lower chest: No acute abnormality.  Hepatobiliary: Normal noncontrast appearance of liver. Peripherally-calcified 0.7 cm gallstone at the gallbladder fundus. No gallbladder wall thickening, or biliary dilatation.  Pancreas: No pancreatic ductal dilatation or surrounding inflammatory changes.  Spleen: Normal in size without focal abnormality.  Adrenals/Urinary Tract: Adrenal glands are unremarkable. Kidneys are normal, without renal calculi, focal lesion, or hydronephrosis. Bladder is unremarkable.  Stomach/Bowel: Stomach is within normal limits. Appendix appears normal. No evidence of bowel wall thickening, distention, or inflammatory changes.  Vascular/Lymphatic: Trace aortic atherosclerosis. No enlarged abdominal or pelvic lymph nodes.  Reproductive: Uterus and bilateral adnexa are unremarkable.  Other: No abdominal wall hernia or abnormality. No abdominopelvic ascites.  Musculoskeletal: No acute or significant osseous findings.  IMPRESSION: 1. No hydronephrosis or radiodense renal calculus. 2. Cholelithiasis. No additional findings to suggest acute cholecystitis.   Electronically Signed By: Roanna Banning M.D. On: 06/19/2021 19:18   Assessment & Plan:    1. Dysuria (Primary) Urine for culture Doxycycline 100mg  BID for 7 days - Urinalysis, Routine w reflex microscopic - BLADDER SCAN AMB NON-IMAGING  2.  OAB (overactive bladder) Restart vesciare 10mg  daily   No follow-ups on file.  Wilkie Aye, MD  [facility] [place]

## 2023-02-28 NOTE — Patient Instructions (Signed)
 Infeccin de las vas urinarias en las mujeres Urinary Tract Infection, Female La infeccin de las vas urinarias (IVU) se presenta en las vas urinarias. Las vas urinarias estn formadas por rganos que producen, Barrister's clerk y eliminan la orina en el cuerpo. Estos rganos incluyen los siguientes: Los riones. Los urteres. La vejiga. La uretra. Cules son las causas? La mayora de las IVU son causadas por grmenes llamados bacterias. Pueden estar dentro o cerca de los genitales. Estos grmenes proliferan y causan hinchazn en las vas urinarias. Qu incrementa el riesgo? Una persona es ms propensa a tener una IVU si: Es mujer. La uretra es ms corta en las mujeres que en los hombres. Tiene colocado un tubo blando llamado catter que drena la orina. No puede controlar cuando orina o defeca. Tiene dificultad para orinar debido a: Un clculo renal. Una obstruccin urinaria. Un trastorno nervioso que afecta la vejiga. No bebe una cantidad suficiente de lquido. Es sexualmente activa. [place] un mtodo anticonceptivo que se coloca dentro de la vagina, como un espermicida. Est embarazada. Tiene niveles bajos de la hormona estrgeno en el cuerpo. Es Psychologist, sport and exercise. Tambin es ms probable que contraiga una IVU si tiene otros problemas de [place]. Pueden incluir: Diabetes. El sistema inmunitario debilitado. Su sistema inmunitario es [address] de defensa de su cuerpo. Anemia drepanoctica. Lesin en la columna vertebral. Cules son los signos o sntomas? Entre los sntomas, se pueden incluir los siguientes: Necesidad inmediata de Geographical information systems officer. Hacer poca cantidad de orina con mucha frecuencia. Dolor o ardor al Geographical information systems officer. Sangre en la orina. Orina con mal olor u [employer]. Dolor en la parte inferior de la espalda o en el vientre. Tambin puede: Sentirse confundido. Este puede ser Financial risk analyst sntoma en los adultos [place]. Vomitar. No tener apetito. Cansarse o irritarse con facilidad. Tener  fiebre o escalofros. Cmo se diagnostica? Las IVU se diagnostican en funcin de los antecedentes mdicos y de Nurse, learning disability. Tambin pueden hacerle otros estudios. Pueden incluir: Anlisis de [place]. Anlisis de [place]. Pruebas de infecciones de transmisin sexual (ITS). Si ha tenido ms de una IVU, es posible que deba hacerse estudios de diagnstico por imgenes para Financial risk analyst por qu sigue contrayndolas. Cmo se trata? Una IVU puede tratarse de las siguientes maneras: Tomar antibiticos u otros medicamentos. Beber suficiente lquido como para Pharmacologist la orina de color amarillo plido. En casos poco frecuentes, una IVU puede causar una afeccin muy grave llamada sepsis. Es posible que la sepsis deba recibir Pharmacist, hospital hospital. Siga estas instrucciones en su casa: Medicamentos [place]ome los medicamentos solamente como se lo haya indicado el mdico. Si le dieron antibiticos, tmelos o selos como se lo haya indicado el mdico. No deje de tomarlos aunque comience a sentirse mejor. Instrucciones generales Asegrese de hacer lo siguiente: Orine con frecuencia y vace la vejiga por completo. No contenga la [employer]. Lmpiese de adelante hacia atrs despus de Automotive engineer. Use cada trozo de papel higinico una sola vez cuando se limpie. Orine despus de [employer]. No se haga duchas vaginales ni use aerosoles o talcos en la zona genital. Comunquese con un mdico si: Sus sntomas no han mejorado despus de 1 o 2 das de tratamiento con antibiticos. Los sntomas desaparecen y Stage manager. Tiene fiebre o escalofros. Vomita o tiene ganas de vomitar. Solicite ayuda de inmediato si: Tiene dolor muy intenso en la espalda o la parte baja del vientre. Se desmaya. Esta informacin no tiene Theme park manager el consejo del mdico. Manufacturing engineer  de hacerle al mdico cualquier pregunta que tenga. Document Revised: 09/01/2022 Document Reviewed: 09/01/2022 Elsevier Patient  Education  2024 [employer].

## 2023-02-28 NOTE — Progress Notes (Signed)
 post void residual=0 ?

## 2023-03-03 LAB — URINE CULTURE

## 2023-03-06 ENCOUNTER — Encounter: Payer: Self-pay | Admitting: Urology

## 2023-04-18 ENCOUNTER — Encounter: Payer: Self-pay | Admitting: Urology

## 2023-04-18 ENCOUNTER — Ambulatory Visit (INDEPENDENT_AMBULATORY_CARE_PROVIDER_SITE_OTHER): Payer: Self-pay | Admitting: Urology

## 2023-04-18 VITALS — BP 108/74 | HR 86

## 2023-04-18 DIAGNOSIS — K8070 Calculus of gallbladder and bile duct without cholecystitis without obstruction: Secondary | ICD-10-CM

## 2023-04-18 DIAGNOSIS — R300 Dysuria: Secondary | ICD-10-CM

## 2023-04-18 DIAGNOSIS — Z87898 Personal history of other specified conditions: Secondary | ICD-10-CM

## 2023-04-18 DIAGNOSIS — N3281 Overactive bladder: Secondary | ICD-10-CM

## 2023-04-18 LAB — URINALYSIS, ROUTINE W REFLEX MICROSCOPIC
Bilirubin, UA: NEGATIVE
Glucose, UA: NEGATIVE
Ketones, UA: NEGATIVE
Nitrite, UA: NEGATIVE
Protein,UA: NEGATIVE
RBC, UA: NEGATIVE
Specific Gravity, UA: 1.01 (ref 1.005–1.030)
Urobilinogen, Ur: 0.2 mg/dL (ref 0.2–1.0)
pH, UA: 7 (ref 5.0–7.5)

## 2023-04-18 LAB — MICROSCOPIC EXAMINATION: Bacteria, UA: NONE SEEN

## 2023-04-18 MED ORDER — FESOTERODINE FUMARATE ER 8 MG PO TB24: 8.0000 mg | ORAL_TABLET | Freq: Every day | ORAL | 11 refills | Status: DC

## 2023-04-18 NOTE — Patient Instructions (Signed)
 Vejiga hiperactiva en adultos: qu debe saber Overactive Bladder in Adults: What to Know  La vejiga hiperactiva (VH) ocurre cuando uno tiene dificultad para contener la orina. Puede sentir la necesidad de Geographical information systems officer con frecuencia o de repente. Tambin puede tener fugas de orina si no puede llegar al bao con la rapidez suficiente. Estos sntomas pueden obstaculizar su vida diaria. Cules son las causas? La VH puede deberse a un problema en las seales nerviosas entre la vejiga y Secretary/administrator. La vejiga puede recibir la seal de vaciarse antes de que est llena. O bien, la vejiga puede ser demasiado sensible. Esto puede hacer que se comprima demasiado pronto. Otras causas son: Problemas mdicos, como: Una infeccin de las vas urinarias (IU). Hinchazn o clculos en la vejiga. Un tumor, que es un crecimiento de clulas que no es normal. Diabetes. Debilidad muscular o nerviosa. Las causas de esto pueden ser las siguientes: Una lesin en la mdula espinal. Un accidente cerebrovascular. Esclerosis mltiple. Ciruga en tejidos cercanos. Consumo de cafena o alcohol. Algunos medicamentos. Estos incluyen los que reducen la cantidad de lquido en el cuerpo. Dificultades para defecar, lo que tambin se llama estreimiento. Qu incrementa el riesgo? Usted puede correr ms riesgo si: Es Psychiatrist. Est atravesando la menopausia. Tiene la prstata grande o problemas con la prstata. Tiene sobrepeso. Fuma. Ingiere alimentos o bebidas que irritan la vejiga. Estos pueden incluir el t y los alimentos muy condimentados. Cules son los signos o sntomas? Necesidad repentina e intensa de Geographical information systems officer. Orinar 8 o ms veces al [employer]. Tener fugas de [place]. Levantarse para orinar 2 o ms veces por noche. Cmo se diagnostica? Se puede diagnosticar en funcin de los sntomas, los antecedentes mdicos y un examen. Tambin pueden hacerle pruebas. Pueden incluir: Anlisis de sangre. Pruebas de orina para  determinar si hay infeccin. Pruebas para estudiar el flujo de la [place]. Estas se llaman pruebas urodinmicas. Tambin es posible que deba consultar a un experto en problemas de la vejiga llamado urlogo. Cmo se trata? El tratamiento depende de la causa y de la gravedad de sus sntomas. Puede incluir lo siguiente: Entrenamiento de la vejiga, por ejemplo: Aprender a controlar la necesidad imperiosa de orinar al orinar a ciertas horas o momentos. Haga los ejercicios de [place]. Estos pueden ayudarlo a [employer] que inician y detienen el flujo de [place]. Dispositivos especiales, por ejemplo: Biorretroalimentacin. Este tratamiento [place] sensores para ayudarlo a aprender [employer]. Estimulacin elctrica. Este tratamiento [place] electricidad para [employer] nervios y los msculos de la vejiga funcionen. Un pesario. Esto puede introducirse en la vagina para sostener la vejiga. Medicamentos para: Warehouse manager una infeccin urinaria (IU). Evitar que la vejiga libere orina en el momento incorrecto. Calmar los msculos de la vejiga. Ciruga para: Ayudar a las [employer] que controlan la [place]. Cambiar la forma de la vejiga. Esto se realiza solamente en [employer] graves. Siga estas instrucciones en su casa: Comida y bebida  [employer] cambios en su dieta que le hayan indicado. Es posible que deba hacer lo siguiente: Product manager pequeas cantidades de lquido Freight forwarder de grandes cantidades de una vez. Consumir menos cafena o alcohol. Tomar medidas para tratar o prevenir los problemas para defecar. Posiblemente deba consumir alimentos ricos en fibra, como legumbres, cereales integrales, y frutas y verduras frescas. Estilo de vida Baje de [place], si es necesario. No fume, vapee ni consuma nicotina o tabaco. Instrucciones generales Tome los medicamentos nicamente segn las indicaciones. Si le han  recetado antibiticos, tmelos como se lo hayan indicado. No deje de tomarlos  aunque comience a sentirse mejor. Use los dispositivos como se lo hayan indicado. Si es necesario, use apsitos para absorber cualquier fuga de orina que pueda [place]. Lleve un registro de cunto bebe, cundo bebe y cundo aparece la necesidad de Geographical information systems officer. Comunquese con un mdico si: Los sntomas no mejoran con Scientist, research (medical). No puede controlar la vejiga. Tiene fiebre o siente escalofros. Esta informacin no tiene Theme park manager el consejo del mdico. Asegrese de hacerle al mdico cualquier pregunta que tenga. Document Revised: 10/23/2022 Document Reviewed: 10/23/2022 Elsevier Patient Education  2024 [employer].

## 2023-04-18 NOTE — Progress Notes (Signed)
 04/18/2023 2:21 PM   Brianna [name] [birth year] [id number]  Referring provider: Lemon Qua, NP [address] [place],  [place] [address]  Followup OAb   HPI: [name] is a 53yo here for followup for OAb and dysuria. 2 weeks ago she was treated with antibiotics for a UTI and her LUTS improved and she has a slight burn when she urinates. She takes vesicare  10mg  daily which improves her incontinence but she still has incontinence which is bothersome to her.    PMH: Past Medical History:  Diagnosis Date   Ectopic pregnancy    Hypertension    Kidney stones    Vaginal Pap smear, abnormal     Surgical History: Past Surgical History:  Procedure Laterality Date   EYE SURGERY      Home Medications:  Allergies as of 04/18/2023   No Known Allergies      Medication List        Accurate as of April 18, 2023  2:21 PM. If you have any questions, ask your nurse or doctor.          doxycycline  100 MG capsule Commonly known as: VIBRAMYCIN  Take 1 capsule (100 mg total) by mouth every 12 (twelve) hours.   FISH OIL PO Take by mouth.   ibuprofen 200 MG tablet Commonly known as: ADVIL Take 400 mg by mouth in the morning and at bedtime.   lisinopril-hydrochlorothiazide 20-12.5 MG tablet Commonly known as: ZESTORETIC Take 1 tablet by mouth daily.   solifenacin  10 MG tablet Commonly known as: VESICARE  Take 1 tablet (10 mg total) by mouth daily.        Allergies: No Known Allergies  Family History: Family History  Problem Relation Age of Onset   Stroke Father    Cancer Mother        stomach   Diabetes Brother    Hypertension Brother    Diabetes Sister    Hypertension Sister     Social History:  reports that she has never smoked. She has never used smokeless tobacco. She reports that she does not currently use alcohol. She reports that she does not use drugs.  ROS: All other review of systems were reviewed and are negative except what is noted above in  HPI  Physical Exam: BP 108/74   Pulse 86   LMP 01/20/2015   Constitutional:  Alert and oriented, No acute distress. HEENT: [place] AT, moist mucus membranes.  Trachea midline, no masses. Cardiovascular: No clubbing, cyanosis, or edema. Respiratory: Normal respiratory effort, no increased work of breathing. GI: Abdomen is soft, nontender, nondistended, no abdominal masses GU: No CVA tenderness.  Lymph: No cervical or inguinal lymphadenopathy. Skin: No rashes, bruises or suspicious lesions. Neurologic: Grossly intact, no focal deficits, moving all 4 extremities. Psychiatric: Normal mood and affect.  Laboratory Data: No results found for: "WBC", "HGB", "HCT", "MCV", "PLT"  No results found for: "CREATININE"  No results found for: "PSA"  No results found for: "TESTOSTERONE"  No results found for: "HGBA1C"  Urinalysis    Component Value Date/Time   APPEARANCEUR Clear 02/28/2023 1105   GLUCOSEU Negative 02/28/2023 1105   BILIRUBINUR Negative 02/28/2023 1105   PROTEINUR Negative 02/28/2023 1105   NITRITE Negative 02/28/2023 1105   LEUKOCYTESUR Trace (A) 02/28/2023 1105    Lab Results  Component Value Date   LABMICR See below: 02/28/2023   WBCUA 0-5 02/28/2023   LABEPIT >10 (A) 02/28/2023   MUCUS Present 09/19/2021   BACTERIA Few (A) 02/28/2023  Pertinent Imaging:  No results found for this or any previous visit.  No results found for this or any previous visit.  No results found for this or any previous visit.  No results found for this or any previous visit.  No results found for this or any previous visit.  No results found for this or any previous visit.  No results found for this or any previous visit.  Results for orders placed in visit on 05/16/21  CT RENAL STONE STUDY  Narrative CLINICAL DATA:  Flank pain, kidney stone suspected  EXAM: CT ABDOMEN AND PELVIS WITHOUT CONTRAST  TECHNIQUE: Multidetector CT imaging of the abdomen and pelvis was  performed following the standard protocol without IV contrast.  RADIATION DOSE REDUCTION: This exam was performed according to the departmental dose-optimization program which includes automated exposure control, adjustment of the mA and/or kV according to patient size and/or use of iterative reconstruction technique.  COMPARISON:  Pelvic ultrasound 07/16/2018  FINDINGS: Lower chest: No acute abnormality.  Hepatobiliary: Normal noncontrast appearance of liver. Peripherally-calcified 0.7 cm gallstone at the gallbladder fundus. No gallbladder wall thickening, or biliary dilatation.  Pancreas: No pancreatic ductal dilatation or surrounding inflammatory changes.  Spleen: Normal in size without focal abnormality.  Adrenals/Urinary Tract: Adrenal glands are unremarkable. Kidneys are normal, without renal calculi, focal lesion, or hydronephrosis. Bladder is unremarkable.  Stomach/Bowel: Stomach is within normal limits. Appendix appears normal. No evidence of bowel wall thickening, distention, or inflammatory changes.  Vascular/Lymphatic: Trace aortic atherosclerosis. No enlarged abdominal or pelvic lymph nodes.  Reproductive: Uterus and bilateral adnexa are unremarkable.  Other: No abdominal wall hernia or abnormality. No abdominopelvic ascites.  Musculoskeletal: No acute or significant osseous findings.  IMPRESSION: 1. No hydronephrosis or radiodense renal calculus. 2. Cholelithiasis. No additional findings to suggest acute cholecystitis.   Electronically Signed By: Art Largo M.D. On: 06/19/2021 19:18   Assessment & Plan:    1. OAB (overactive bladder) (Primary) Toviaz  8mg  daily - Urinalysis, Routine w reflex microscopic  2. Dysuria -resolved   No follow-ups on file.  [name], MD  [facility] 

## 2023-04-22 LAB — URINE CULTURE

## 2023-05-01 ENCOUNTER — Telehealth: Payer: Self-pay

## 2023-05-01 MED ORDER — SULFAMETHOXAZOLE-TRIMETHOPRIM 800-160 MG PO TABS: 1.0000 | ORAL_TABLET | Freq: Two times a day (BID) | ORAL | 0 refills | Status: DC

## 2023-05-01 NOTE — Telephone Encounter (Signed)
-----   Message from [name] sent at 05/01/2023  9:39 AM EDT ----- Please send bactrimd DS BID for 7 days ----- Message ----- From: Garner Jury Lab Results In Sent: 04/18/2023   3:36 PM EDT To: Marco Severs, MD

## 2023-05-01 NOTE — Telephone Encounter (Signed)
 Language line called and representative ID # [id number] assisted in translating. Patient made aware urine culture was positive and Bactrim  DS was sent to the [facility] on file.

## 2023-05-10 ENCOUNTER — Telehealth: Payer: Self-pay | Admitting: Urology

## 2023-05-10 ENCOUNTER — Encounter: Payer: Self-pay | Admitting: General Surgery

## 2023-05-10 ENCOUNTER — Ambulatory Visit (INDEPENDENT_AMBULATORY_CARE_PROVIDER_SITE_OTHER): Payer: Self-pay | Admitting: General Surgery

## 2023-05-10 VITALS — BP 112/71 | HR 87 | Temp 98.8°F | Resp 14 | Ht 62.0 in | Wt 217.0 lb

## 2023-05-10 DIAGNOSIS — K8020 Calculus of gallbladder without cholecystitis without obstruction: Secondary | ICD-10-CM

## 2023-05-10 NOTE — Telephone Encounter (Signed)
 Cannot afford fesoterodine , would like an alternative

## 2023-05-10 NOTE — Progress Notes (Signed)
 Brianna [name]; [id number]; 1970-12-13   HPI Patient is a 53 year old Hispanic female who returns to our care for evaluation treatment of right upper quadrant abdominal pain secondary to cholelithiasis.  She has previously been seen in our office for the same thing.  She states recently she has had worsening right upper quadrant abdominal pain which radiates around her right side to her back.  She does have nausea.  To relieve her pain, she makes herself throw up.  She denies any fever, chills, or jaundice.  The episodes seem to be increasing in frequency and intensity.  She would like to have her gallbladder removed.  An interpreter was present throughout the examination. Past Medical History:  Diagnosis Date   Ectopic pregnancy    Hypertension    Kidney stones    Vaginal Pap smear, abnormal     Past Surgical History:  Procedure Laterality Date   EYE SURGERY      Family History  Problem Relation Age of Onset   Stroke Father    Cancer Mother        stomach   Diabetes Brother    Hypertension Brother    Diabetes Sister    Hypertension Sister     Current Outpatient Medications on File Prior to Visit  Medication Sig Dispense Refill   fesoterodine  (TOVIAZ ) 8 MG TB24 tablet Take 1 tablet (8 mg total) by mouth daily. 30 tablet 11   ibuprofen (ADVIL) 200 MG tablet Take 400 mg by mouth in the morning and at bedtime.     lisinopril-hydrochlorothiazide (ZESTORETIC) 20-12.5 MG tablet Take 1 tablet by mouth daily.     Omega-3 Fatty Acids (FISH OIL PO) Take by mouth.     sulfamethoxazole -trimethoprim  (BACTRIM  DS) 800-160 MG tablet Take 1 tablet by mouth 2 (two) times daily. 14 tablet 0   No current facility-administered medications on file prior to visit.    No Known Allergies  Social History   Substance and Sexual Activity  Alcohol Use Not Currently    Social History   Tobacco Use  Smoking Status Never  Smokeless Tobacco Never    Review of Systems  Constitutional:   Positive for malaise/fatigue.  HENT: Negative.    Eyes:  Positive for blurred vision.  Respiratory: Negative.    Cardiovascular: Negative.   Gastrointestinal:  Positive for abdominal pain and nausea.  Genitourinary:  Positive for frequency.  Musculoskeletal:  Positive for back pain and joint pain.  Skin: Negative.   Neurological: Negative.   Endo/Heme/Allergies: Negative.   Psychiatric/Behavioral: Negative.      Objective   Vitals:   05/10/23 1122  BP: 112/71  Pulse: 87  Resp: 14  Temp: 98.8 F (37.1 C)  SpO2: 95%    Physical Exam Vitals reviewed.  Constitutional:      Appearance: Normal appearance. She is obese. She is not ill-appearing.  HENT:     Head: Normocephalic and atraumatic.  Eyes:     General: No scleral icterus. Cardiovascular:     Rate and Rhythm: Normal rate and regular rhythm.     Heart sounds: Normal heart sounds. No murmur heard.    No friction rub. No gallop.  Pulmonary:     Effort: Pulmonary effort is normal. No respiratory distress.     Breath sounds: Normal breath sounds. No stridor. No wheezing, rhonchi or rales.  Abdominal:     General: Bowel sounds are normal. There is no distension.     Palpations: Abdomen is soft. There is no mass.  Tenderness: There is no abdominal tenderness. There is no guarding or rebound.     Hernia: No hernia is present.  Skin:    General: Skin is warm and dry.  Neurological:     Mental Status: She is alert and oriented to person, place, and time.   Previous office notes reviewed  Assessment  Biliary colic secondary to cholelithiasis Plan  Patient is scheduled for robotic assisted laparoscopic cholecystectomy on 06/08/2023.  The risks and benefits of the procedure including bleeding, infection, hepatobiliary injury, and the possibility of an open procedure were fully explained to the patient, who gave informed consent.

## 2023-05-10 NOTE — Addendum Note (Signed)
 Addended by: Cathalene Clipper on: 05/10/2023 02:19 PM   Modules accepted: Orders

## 2023-05-10 NOTE — Telephone Encounter (Signed)
 Only speaks Spanish will need interpreter

## 2023-05-10 NOTE — H&P (Signed)
 Brianna [name]; [id number]; 1970-12-13   HPI Patient is a 53 year old Hispanic female who returns to our care for evaluation treatment of right upper quadrant abdominal pain secondary to cholelithiasis.  She has previously been seen in our office for the same thing.  She states recently she has had worsening right upper quadrant abdominal pain which radiates around her right side to her back.  She does have nausea.  To relieve her pain, she makes herself throw up.  She denies any fever, chills, or jaundice.  The episodes seem to be increasing in frequency and intensity.  She would like to have her gallbladder removed.  An interpreter was present throughout the examination. Past Medical History:  Diagnosis Date   Ectopic pregnancy    Hypertension    Kidney stones    Vaginal Pap smear, abnormal     Past Surgical History:  Procedure Laterality Date   EYE SURGERY      Family History  Problem Relation Age of Onset   Stroke Father    Cancer Mother        stomach   Diabetes Brother    Hypertension Brother    Diabetes Sister    Hypertension Sister     Current Outpatient Medications on File Prior to Visit  Medication Sig Dispense Refill   fesoterodine  (TOVIAZ ) 8 MG TB24 tablet Take 1 tablet (8 mg total) by mouth daily. 30 tablet 11   ibuprofen (ADVIL) 200 MG tablet Take 400 mg by mouth in the morning and at bedtime.     lisinopril-hydrochlorothiazide (ZESTORETIC) 20-12.5 MG tablet Take 1 tablet by mouth daily.     Omega-3 Fatty Acids (FISH OIL PO) Take by mouth.     sulfamethoxazole -trimethoprim  (BACTRIM  DS) 800-160 MG tablet Take 1 tablet by mouth 2 (two) times daily. 14 tablet 0   No current facility-administered medications on file prior to visit.    No Known Allergies  Social History   Substance and Sexual Activity  Alcohol Use Not Currently    Social History   Tobacco Use  Smoking Status Never  Smokeless Tobacco Never    Review of Systems  Constitutional:   Positive for malaise/fatigue.  HENT: Negative.    Eyes:  Positive for blurred vision.  Respiratory: Negative.    Cardiovascular: Negative.   Gastrointestinal:  Positive for abdominal pain and nausea.  Genitourinary:  Positive for frequency.  Musculoskeletal:  Positive for back pain and joint pain.  Skin: Negative.   Neurological: Negative.   Endo/Heme/Allergies: Negative.   Psychiatric/Behavioral: Negative.      Objective   Vitals:   05/10/23 1122  BP: 112/71  Pulse: 87  Resp: 14  Temp: 98.8 F (37.1 C)  SpO2: 95%    Physical Exam Vitals reviewed.  Constitutional:      Appearance: Normal appearance. She is obese. She is not ill-appearing.  HENT:     Head: Normocephalic and atraumatic.  Eyes:     General: No scleral icterus. Cardiovascular:     Rate and Rhythm: Normal rate and regular rhythm.     Heart sounds: Normal heart sounds. No murmur heard.    No friction rub. No gallop.  Pulmonary:     Effort: Pulmonary effort is normal. No respiratory distress.     Breath sounds: Normal breath sounds. No stridor. No wheezing, rhonchi or rales.  Abdominal:     General: Bowel sounds are normal. There is no distension.     Palpations: Abdomen is soft. There is no mass.  Tenderness: There is no abdominal tenderness. There is no guarding or rebound.     Hernia: No hernia is present.  Skin:    General: Skin is warm and dry.  Neurological:     Mental Status: She is alert and oriented to person, place, and time.   Previous office notes reviewed  Assessment  Biliary colic secondary to cholelithiasis Plan  Patient is scheduled for robotic assisted laparoscopic cholecystectomy on 06/08/2023.  The risks and benefits of the procedure including bleeding, infection, hepatobiliary injury, and the possibility of an open procedure were fully explained to the patient, who gave informed consent.

## 2023-05-15 MED ORDER — TROSPIUM CHLORIDE ER 60 MG PO CP24: 60.0000 mg | ORAL_CAPSULE | Freq: Every day | ORAL | 11 refills | Status: DC

## 2023-05-15 NOTE — Telephone Encounter (Signed)
 Rx sent. Spoke with patient through language line. ID [id number] Patient made aware and voiced understanding.

## 2023-06-05 NOTE — Patient Instructions (Addendum)
 Brianna [name]  06/05/2023     @PREFPERIOPPHARMACY @   Your procedure is scheduled on  06/08/2023.   Report to [facility] at  0600  A.M.   Call this number if you have problems the morning of surgery:  [phone]  If you experience any cold or flu symptoms such as cough, fever, chills, shortness of breath, etc. between now and your scheduled surgery, please notify us  at the above number.   Remember:  Do not eat after midnight.   You may drink clear liquids until  0330 am on 06/08/2023.    Clear liquids allowed are:                    Water, Juice (No red color; non-citric and without pulp; diabetics please choose diet or no sugar options), Carbonated beverages (diabetics please choose diet or no sugar options), Clear Tea (No creamer, milk, or cream, including half & half and powdered creamer), Black Coffee Only (No creamer, milk or cream, including half & half and powdered creamer), and Clear Sports drink (No red color; diabetics please choose diet or no sugar options)    Take these medicines the morning of surgery with A SIP OF WATER                                                          None.    Do not wear jewelry, make-up or nail polish, including gel polish,  artificial nails, or any other type of covering on natural nails (fingers and  toes).  Do not wear lotions, powders, or perfumes, or deodorant.  Do not shave 48 hours prior to surgery.  Men may shave face and neck.  Do not bring valuables to the hospital.  [facility] is not responsible for any belongings or valuables.  Contacts, dentures or bridgework may not be worn into surgery.  Leave your suitcase in the car.  After surgery it may be brought to your room.  For patients admitted to the hospital, discharge time will be determined by your treatment team.  Patients discharged the day of surgery will not be allowed to drive home and must have someone with them for 24 hours.    Special  instructions:   DO NOT smoke tobacco or vape for 24 hours before your procedure.  Please read over the following fact sheets that you were given. Coughing and Deep Breathing, Surgical Site Infection Prevention, Anesthesia Post-op Instructions, and Care and Recovery After Surgery        Minimally Invasive Cholecystectomy, Care After What can I expect after the procedure? After the procedure, it is common to: Have pain at the areas of surgery. You will be given medicines for pain. Vomit or feel like you may vomit. Feel fullness in the belly (bloating) or have pain in the shoulder. This comes from the gas that was used during the surgery. Follow these instructions at home: Medicines Take over-the-counter and prescription medicines only as told by your doctor. If you were prescribed an antibiotic medicine, take it as told by your doctor. Do not stop taking it even if you start to feel better. If told, take steps to prevent problems with pooping (constipation). You may need to: Drink enough fluid to keep your pee (urine) pale  yellow. Take medicines. You will be told what medicines to take. Eat foods that are high in fiber. These include beans, whole grains, and fresh fruits and vegetables. Limit foods that are high in fat and sugar. These include fried or sweet foods. Ask your doctor if you should avoid driving or using machines while you are taking your medicine. Incision care  Follow instructions from your doctor about how to take care of your cuts from surgery (incisions). Make sure you: Wash your hands with soap and water for at least 20 seconds before and after you change your bandage (dressing). If you cannot use soap and water, use hand sanitizer. Change your bandage. Leave stitches (sutures) or skin glue in place for at least 2 weeks. Leave tape strips alone unless you are told to take them off. You may trim the edges of the tape strips if they curl up. Do not take baths, swim, or  use a hot tub. Ask your doctor about taking showers or sponge baths. Check your incision area every day for signs of infection. Check for: More redness, swelling, or pain. Fluid or blood. Warmth. Pus or a bad smell. Activity Rest as told by your doctor. Do not do activities that require a lot of effort. Get up to take short walks every 1 to 2 hours. Ask for help if you feel weak or unsteady. Do not lift anything that is heavier than 10 lb (4.5 kg), or the limit that you are told. Do not play contact sports until your doctor says it is okay. Do not return to work or school until your doctor says it is okay. Return to your normal activities when your doctor says that it is safe. General instructions If you were given a sedative during your procedure, do not drive or use machines until your doctor says that it is safe. A sedative is a medicine that helps you relax. Keep all follow-up visits. Contact a doctor if: You get a rash. You have more redness, swelling, or pain around your incisions. You have fluid or blood coming from your incisions. Your incisions feel warm to the touch. You have pus or a bad smell coming from your incisions. You have a fever. One or more of your incisions breaks open. Get help right away if: You have trouble breathing. You have chest pain. You have pain that is getting worse in your shoulders. You faint or feel dizzy when you stand. You have very bad pain in your belly (abdomen). You feel like you may vomit or you vomit, and this lasts for more than one day. You have leg pain. These symptoms may be an emergency. Get help right away. Call 911. Do not wait to see if the symptoms will go away. Do not drive yourself to the hospital. Summary After your surgery, it is common to have pain at the areas of surgery. You may also vomit or feel fullness in the belly. Follow your doctor's instructions about medicine, activity restrictions, and caring for your surgery  areas. Do not do activities that require a lot of effort. Contact a doctor if you have a fever or other signs of infection, such as more redness, swelling, or pain around your incisions. Get help right away if you have chest pain, increasing pain in the shoulders, or trouble breathing. This information is not intended to replace advice given to you by your health care provider. Make sure you discuss any questions you have with your health care provider. Document  Revised: 06/28/2020 Document Reviewed: 06/29/2020 Elsevier Patient Education  2024 Elsevier Inc.Anestesia general en adultos, cuidados posteriores General Anesthesia, Adult, Care After La siguiente informacin ofrece orientacin sobre cmo cuidarse despus del procedimiento. El mdico tambin podr darle instrucciones ms especficas. Comunquese con el mdico si tiene problemas o preguntas. Qu puedo esperar despus del procedimiento? Despus del procedimiento, es normal sentir o tener lo siguiente: Dolor o Social worker de la va intravenosa (i.v.). Nuseas o vmitos. Dolor de Advertising copywriter o ronquera. Dificultad para concentrarse. Fro o escalofros. Debilidad, somnolencia o cansancio (fatiga). Malestar y Tourist information centre manager. Estos sntomas pueden afectar partes del cuerpo que no estuvieron involucradas en la ciruga. Siga estas indicaciones en su casa: Durante el perodo de [employer] le haya indicado el mdico:  Descanse. No participe en actividades que impliquen posibles cadas o lesiones. No conduzca ni opere maquinaria. No beba alcohol. No tome somnferos ni medicamentos que causen somnolencia. No tome decisiones trascendentes ni firme documentos importantes. No cuide a nios por su cuenta. Indicaciones generales Beba suficiente lquido como para mantener la orina de color amarillo plido. Si tiene apnea del sueo, la [place] y ciertos medicamentos pueden elevar su riesgo de tener problemas respiratorios. Siga las  instrucciones del mdico respecto al uso del dispositivo para dormir: Siempre que duerma, incluso durante las siestas que tome en [employer]. Mientras tome analgsicos recetados, medicamentos para dormir o medicamentos que producen somnolencia. Retome sus actividades normales segn lo indicado por el mdico. Pregntele al mdico qu actividades son seguras para usted. Use los medicamentos de venta libre y los recetados solamente como se lo haya indicado el mdico. No consuma ningn producto que contenga nicotina o tabaco. Estos productos incluyen cigarrillos, tabaco para Theatre manager y aparatos de vapeo, como los cigarrillos electrnicos. Estos pueden retrasar la cicatrizacin de la incisin despus de la ciruga. Si necesita ayuda para dejar de consumir esos productos, consulte al mdico. Comunquese con un mdico si: Tiene nuseas o vmitos que no mejoran con medicamentos. Vomita cada vez que come o bebe. Su dolor no se alivia con medicamentos. No puede orinar o ve [employer]. Tiene una erupcin cutnea. Tiene fiebre. Solicite ayuda de inmediato si: Tiene dificultad para respirar. Siente dolor en el pecho. Vomita sangre. Estos sntomas pueden Customer service manager. Solicite ayuda de inmediato. Llame al 911. No espere a ver si los sntomas desaparecen. No conduzca por sus propios medios [employer]. Resumen Despus del procedimiento, es comn tener dolor de garganta, ronquera, nuseas, vmitos o sentir debilidad, somnolencia o fatiga. Durante el perodo de tiempo que le haya indicado el mdico, no conduzca ni use maquinaria. Busque ayuda de inmediato si le cuesta respirar, siente dolor en el pecho o vomita sangre. Estos sntomas pueden Customer service manager. Esta informacin no tiene Theme park manager el consejo del mdico. Asegrese de hacerle al mdico cualquier pregunta que tenga. Document Revised: 04/21/2021 Document Reviewed: 04/21/2021 Elsevier Patient Education  2024  Elsevier Inc.Cmo usar clorhexidina en el hogar en la ducha How to Use Chlorhexidine at Home in the [employer] gluconato de clorhexidina (CHG) es un producto de lavado desinfectante (antisptico) que se utiliza para limpiar la piel. Puede eliminar los grmenes que normalmente viven en la piel y mantenerlos alejados durante aproximadamente 24 horas. Si va a realizarse [place], es posible que le indiquen que se duche con CHG en su hogar la noche antes de la [place]. Esto puede reducir el riesgo de infeccin. Para usar CHG en la ducha, siga los  pasos que se indican a continuacin. Materiales necesarios: Producto de Warden/ranger de CHG. Pao limpio. Toalla limpia. Cmo usar CHG en la ducha Siga estos pasos a menos que le indiquen que use CHG de una manera diferente: Comience a ducharse. Lvese la cara y el cabello con el jabn y el champ que utiliza habitualmente. Cierre la [place] o salga de abajo del agua. Vierta el CHG en un pao limpio. No use ningn cepillo o esponja spera. Comience en el cuello y lvese todo el cuerpo [employer]. Asegrese de hacer lo siguiente: Lvese la parte del cuerpo donde le realizarn la ciruga durante al menos 1 minuto. No frote vigorosamente. No use CHG en la cabeza o el rostro a menos que el mdico se lo indique. Si el CHG le entra en los ojos o los odos, enjuguelos bien con agua. No se lave los genitales con CHG. Lvese la espalda y debajo de los brazos. Asegrese de lavar los pliegues de la piel. Deje que el CHG se asiente sobre la piel durante 1 a 2 minutos o el tiempo que le hayan indicado. Enjuguese todo el cuerpo en la ducha, incluidos todos los pliegues y [place]. Cierre la [place]. Seque con una toalla limpia. No se aplique nada sobre la piel despus, como talco, locin o perfume. Pngase pijama o ropa limpia. Si es la noche anterior a la ciruga, duerma en sbanas limpias. Consejos generales Use CHG nicamente como se lo indiquen y  [address] las instrucciones de la [place]. Use toda la cantidad de CHG que le indiquen. Generalmente, es una botella. No fume y mantngase alejado de las llamas despus de usar CHG. Puede sentir la piel pegajosa despus de usar CHG. Esto es normal. La sensacin pegajosa desaparecer a medida que se seque el CHG. No use CHG: Si es alrgico a la clorhexidina o tuvo una reaccin a la clorhexidina en algn momento del pasado. En zonas de la piel que estn lastimadas o tengan cortes o rasguos. En bebs de menos de 2 meses. Comunquese con un mdico si: Tiene preguntas sobre el uso de CHG. La piel se le irrita o le pica. Tiene una erupcin despus de usar CHG. Traga CHG, incluso un poco. Llame al centro de toxicologa local [phone] en [place] Los ojos le pican mucho, se ponen muy rojos o se le hinchan. Nota cambios en su audicin. Tiene dificultad para ver. Si no puede comunicarse con su mdico, acuda a un servicio o una sala de urgencias. No conduzca vehculos usted mismo. Solicite ayuda de inmediato si: Tiene hinchazn u hormigueo en la garganta o la boca. Emite sonidos de silbidos agudos al respirar, ms a menudo al exhalar (sibilancias). Tiene dificultad para respirar. Estos sntomas pueden Customer service manager. Llame al 911 de inmediato. No espere a ver si los sntomas desaparecen. No conduzca por sus propios medios [employer]. Esta informacin no tiene Theme park manager el consejo del mdico. Asegrese de hacerle al mdico cualquier pregunta que tenga. Document Revised: 08/07/2022 Document Reviewed: 08/07/2022 Elsevier Patient Education  2024 [employer].

## 2023-06-06 ENCOUNTER — Encounter (HOSPITAL_COMMUNITY): Payer: Self-pay

## 2023-06-06 ENCOUNTER — Encounter (HOSPITAL_COMMUNITY)
Admission: RE | Admit: 2023-06-06 | Discharge: 2023-06-06 | Disposition: A | Discharge status: Home / Self Care | Source: Ambulatory Visit | Attending: General Surgery | Admitting: General Surgery

## 2023-06-06 VITALS — BP 95/55 | HR 76 | Temp 97.8°F | Resp 18 | Ht 62.0 in | Wt 216.9 lb

## 2023-06-06 DIAGNOSIS — Z01818 Encounter for other preprocedural examination: Secondary | ICD-10-CM | POA: Diagnosis present

## 2023-06-06 DIAGNOSIS — I10 Essential (primary) hypertension: Secondary | ICD-10-CM | POA: Insufficient documentation

## 2023-06-06 HISTORY — DX: Personal history of urinary calculi: Z87.442

## 2023-06-06 HISTORY — DX: Nausea with vomiting, unspecified: R11.2

## 2023-06-06 HISTORY — DX: Other specified postprocedural states: Z98.890

## 2023-06-06 LAB — CBC WITH DIFFERENTIAL/PLATELET
Abs Immature Granulocytes: 0.02 10*3/uL (ref 0.00–0.07)
Basophils Absolute: 0 10*3/uL (ref 0.0–0.1)
Basophils Relative: 1 %
Eosinophils Absolute: 0.1 10*3/uL (ref 0.0–0.5)
Eosinophils Relative: 3 %
HCT: 42 % (ref 36.0–46.0)
Hemoglobin: 14.3 g/dL (ref 12.0–15.0)
Immature Granulocytes: 0 %
Lymphocytes Relative: 37 %
Lymphs Abs: 1.8 10*3/uL (ref 0.7–4.0)
MCH: 30.1 pg (ref 26.0–34.0)
MCHC: 34 g/dL (ref 30.0–36.0)
MCV: 88.4 fL (ref 80.0–100.0)
Monocytes Absolute: 0.3 10*3/uL (ref 0.1–1.0)
Monocytes Relative: 6 %
Neutro Abs: 2.5 10*3/uL (ref 1.7–7.7)
Neutrophils Relative %: 53 %
Platelets: 160 10*3/uL (ref 150–400)
RBC: 4.75 MIL/uL (ref 3.87–5.11)
RDW: 13.3 % (ref 11.5–15.5)
WBC: 4.8 10*3/uL (ref 4.0–10.5)
nRBC: 0 % (ref 0.0–0.2)

## 2023-06-06 LAB — COMPREHENSIVE METABOLIC PANEL WITH GFR
ALT: 94 U/L — ABNORMAL HIGH (ref 0–44)
AST: 94 U/L — ABNORMAL HIGH (ref 15–41)
Albumin: 3.2 g/dL — ABNORMAL LOW (ref 3.5–5.0)
Alkaline Phosphatase: 131 U/L — ABNORMAL HIGH (ref 38–126)
Anion gap: 8 (ref 5–15)
BUN: 13 mg/dL (ref 6–20)
CO2: 24 mmol/L (ref 22–32)
Calcium: 9.1 mg/dL (ref 8.9–10.3)
Chloride: 106 mmol/L (ref 98–111)
Creatinine, Ser: 0.56 mg/dL (ref 0.44–1.00)
GFR, Estimated: >60 mL/min (ref >60)
Glucose, Bld: 106 mg/dL — ABNORMAL HIGH (ref 70–99)
Potassium: 3.5 mmol/L (ref 3.5–5.1)
Sodium: 138 mmol/L (ref 135–145)
Total Bilirubin: 1 mg/dL (ref 0.0–1.2)
Total Protein: 6.5 g/dL (ref 6.5–8.1)

## 2023-06-08 ENCOUNTER — Encounter (HOSPITAL_COMMUNITY)
Admission: RE | Disposition: A | Discharge status: Home / Self Care | Payer: Self-pay | Source: Home / Self Care | Attending: General Surgery

## 2023-06-08 ENCOUNTER — Ambulatory Visit (HOSPITAL_BASED_OUTPATIENT_CLINIC_OR_DEPARTMENT_OTHER): Payer: Self-pay | Admitting: Anesthesiology

## 2023-06-08 ENCOUNTER — Ambulatory Visit (HOSPITAL_COMMUNITY)
Admission: RE | Admit: 2023-06-08 | Discharge: 2023-06-08 | Disposition: A | Discharge status: Home / Self Care | Attending: General Surgery | Admitting: General Surgery

## 2023-06-08 ENCOUNTER — Ambulatory Visit (HOSPITAL_COMMUNITY): Payer: Self-pay | Admitting: Anesthesiology

## 2023-06-08 ENCOUNTER — Encounter (HOSPITAL_COMMUNITY): Payer: Self-pay | Admitting: General Surgery

## 2023-06-08 ENCOUNTER — Other Ambulatory Visit: Payer: Self-pay

## 2023-06-08 DIAGNOSIS — K8010 Calculus of gallbladder with chronic cholecystitis without obstruction: Secondary | ICD-10-CM | POA: Insufficient documentation

## 2023-06-08 DIAGNOSIS — K8020 Calculus of gallbladder without cholecystitis without obstruction: Secondary | ICD-10-CM | POA: Diagnosis not present

## 2023-06-08 DIAGNOSIS — K8070 Calculus of gallbladder and bile duct without cholecystitis without obstruction: Secondary | ICD-10-CM | POA: Diagnosis not present

## 2023-06-08 DIAGNOSIS — I10 Essential (primary) hypertension: Secondary | ICD-10-CM | POA: Insufficient documentation

## 2023-06-08 DIAGNOSIS — K7581 Nonalcoholic steatohepatitis (NASH): Secondary | ICD-10-CM | POA: Insufficient documentation

## 2023-06-08 DIAGNOSIS — Z01818 Encounter for other preprocedural examination: Secondary | ICD-10-CM

## 2023-06-08 DIAGNOSIS — R748 Abnormal levels of other serum enzymes: Secondary | ICD-10-CM

## 2023-06-08 HISTORY — PX: LIVER BIOPSY: SHX301

## 2023-06-08 SURGERY — CHOLECYSTECTOMY, ROBOT-ASSISTED, LAPAROSCOPIC
Anesthesia: General | Site: Liver

## 2023-06-08 MED ORDER — PHENYLEPHRINE 80 MCG/ML (10ML) SYRINGE FOR IV PUSH (FOR BLOOD PRESSURE SUPPORT)
PREFILLED_SYRINGE | INTRAVENOUS | Status: DC | PRN
  Administered 2023-06-08 (×4): 80 ug via INTRAVENOUS

## 2023-06-08 MED ORDER — LIDOCAINE 2% (20 MG/ML) 5 ML SYRINGE
INTRAMUSCULAR | Status: AC
  Filled 2023-06-08: qty 5

## 2023-06-08 MED ORDER — ROCURONIUM BROMIDE 10 MG/ML (PF) SYRINGE
PREFILLED_SYRINGE | INTRAVENOUS | Status: AC
  Filled 2023-06-08: qty 10

## 2023-06-08 MED ORDER — FENTANYL CITRATE (PF) 100 MCG/2ML IJ SOLN
INTRAMUSCULAR | Status: AC
  Filled 2023-06-08: qty 2

## 2023-06-08 MED ORDER — CEFAZOLIN SODIUM-DEXTROSE 2-4 GM/100ML-% IV SOLN
2.0000 g | INTRAVENOUS | Status: AC
  Administered 2023-06-08: 2 g via INTRAVENOUS
  Filled 2023-06-08: qty 100

## 2023-06-08 MED ORDER — PROPOFOL 10 MG/ML IV BOLUS
INTRAVENOUS | Status: DC | PRN
Start: 2023-06-08 — End: 2023-06-08
  Administered 2023-06-08: 30 mg via INTRAVENOUS
  Administered 2023-06-08: 170 mg via INTRAVENOUS

## 2023-06-08 MED ORDER — EPHEDRINE SULFATE-NACL 50-0.9 MG/10ML-% IV SOSY
PREFILLED_SYRINGE | INTRAVENOUS | Status: DC | PRN
Start: 2023-06-08 — End: 2023-06-08
  Administered 2023-06-08: 10 mg via INTRAVENOUS

## 2023-06-08 MED ORDER — ONDANSETRON HCL 4 MG/2ML IJ SOLN: 4.0000 mg | Freq: Once | INTRAMUSCULAR | Status: DC | PRN

## 2023-06-08 MED ORDER — MIDAZOLAM HCL 2 MG/2ML IJ SOLN
INTRAMUSCULAR | Status: DC | PRN
  Administered 2023-06-08: 2 mg via INTRAVENOUS

## 2023-06-08 MED ORDER — MIDAZOLAM HCL 2 MG/2ML IJ SOLN
INTRAMUSCULAR | Status: AC
  Filled 2023-06-08: qty 2

## 2023-06-08 MED ORDER — PROPOFOL 10 MG/ML IV BOLUS
INTRAVENOUS | Status: AC
  Filled 2023-06-08: qty 20

## 2023-06-08 MED ORDER — CHLORHEXIDINE GLUCONATE CLOTH 2 % EX PADS
6.0000 | MEDICATED_PAD | Freq: Once | CUTANEOUS | Status: AC
  Administered 2023-06-08: 6 via TOPICAL

## 2023-06-08 MED ORDER — ACETAMINOPHEN 10 MG/ML IV SOLN
INTRAVENOUS | Status: DC | PRN
Start: 2023-06-08 — End: 2023-06-08
  Administered 2023-06-08: 1000 mg via INTRAVENOUS

## 2023-06-08 MED ORDER — ROCURONIUM BROMIDE 100 MG/10ML IV SOLN
INTRAVENOUS | Status: DC | PRN
Start: 2023-06-08 — End: 2023-06-08
  Administered 2023-06-08: 60 mg via INTRAVENOUS

## 2023-06-08 MED ORDER — DEXAMETHASONE SODIUM PHOSPHATE 10 MG/ML IJ SOLN
INTRAMUSCULAR | Status: DC | PRN
Start: 2023-06-08 — End: 2023-06-08
  Administered 2023-06-08: 10 mg via INTRAVENOUS

## 2023-06-08 MED ORDER — DEXAMETHASONE SODIUM PHOSPHATE 10 MG/ML IJ SOLN
INTRAMUSCULAR | Status: AC
  Filled 2023-06-08: qty 1

## 2023-06-08 MED ORDER — OXYCODONE HCL 5 MG/5ML PO SOLN: 5.0000 mg | Freq: Once | ORAL | Status: AC | PRN

## 2023-06-08 MED ORDER — HEMOSTATIC AGENTS (NO CHARGE) OPTIME
TOPICAL | Status: DC | PRN
  Administered 2023-06-08: 1 via TOPICAL

## 2023-06-08 MED ORDER — BUPIVACAINE HCL (PF) 0.5 % IJ SOLN
INTRAMUSCULAR | Status: DC | PRN
Start: 2023-06-08 — End: 2023-06-08
  Administered 2023-06-08: 30 mL

## 2023-06-08 MED ORDER — KETOROLAC TROMETHAMINE 30 MG/ML IJ SOLN
30.0000 mg | Freq: Once | INTRAMUSCULAR | Status: AC
  Administered 2023-06-08: 30 mg via INTRAVENOUS
  Filled 2023-06-08: qty 1

## 2023-06-08 MED ORDER — ONDANSETRON HCL 4 MG/2ML IJ SOLN
INTRAMUSCULAR | Status: AC
  Filled 2023-06-08: qty 2

## 2023-06-08 MED ORDER — OXYCODONE HCL 5 MG PO TABS: 5.0000 mg | ORAL_TABLET | ORAL | 0 refills | Status: DC | PRN

## 2023-06-08 MED ORDER — ORAL CARE MOUTH RINSE: 15.0000 mL | Freq: Once | OROMUCOSAL | Status: AC

## 2023-06-08 MED ORDER — SCOPOLAMINE 1 MG/3DAYS TD PT72
MEDICATED_PATCH | TRANSDERMAL | Status: AC
  Filled 2023-06-08: qty 1

## 2023-06-08 MED ORDER — INDOCYANINE GREEN 25 MG IV SOLR: 2.5000 mg | Freq: Once | INTRAVENOUS | Status: AC

## 2023-06-08 MED ORDER — LIDOCAINE 2% (20 MG/ML) 5 ML SYRINGE
INTRAMUSCULAR | Status: DC | PRN
  Administered 2023-06-08: 80 mg via INTRAVENOUS

## 2023-06-08 MED ORDER — BUPIVACAINE HCL (PF) 0.5 % IJ SOLN
INTRAMUSCULAR | Status: AC
  Filled 2023-06-08: qty 30

## 2023-06-08 MED ORDER — ONDANSETRON HCL 4 MG/2ML IJ SOLN
INTRAMUSCULAR | Status: DC | PRN
Start: 2023-06-08 — End: 2023-06-08
  Administered 2023-06-08: 4 mg via INTRAVENOUS

## 2023-06-08 MED ORDER — FENTANYL CITRATE (PF) 100 MCG/2ML IJ SOLN
INTRAMUSCULAR | Status: DC | PRN
  Administered 2023-06-08 (×3): 50 ug via INTRAVENOUS

## 2023-06-08 MED ORDER — OXYCODONE HCL 5 MG PO TABS
5.0000 mg | ORAL_TABLET | Freq: Once | ORAL | Status: AC | PRN
  Administered 2023-06-08: 5 mg via ORAL
  Filled 2023-06-08: qty 1

## 2023-06-08 MED ORDER — LACTATED RINGERS IV SOLN: INTRAVENOUS | Status: DC

## 2023-06-08 MED ORDER — SUGAMMADEX SODIUM 200 MG/2ML IV SOLN
INTRAVENOUS | Status: DC | PRN
  Administered 2023-06-08: 200 mg via INTRAVENOUS

## 2023-06-08 MED ORDER — LACTATED RINGERS IV SOLN: INTRAVENOUS | Status: DC | PRN

## 2023-06-08 MED ORDER — SCOPOLAMINE 1 MG/3DAYS TD PT72
MEDICATED_PATCH | TRANSDERMAL | Status: DC | PRN
  Administered 2023-06-08: 1 via TRANSDERMAL

## 2023-06-08 MED ORDER — STERILE WATER FOR IRRIGATION IR SOLN
Status: DC | PRN
  Administered 2023-06-08: 500 mL

## 2023-06-08 MED ORDER — DEXMEDETOMIDINE HCL IN NACL 80 MCG/20ML IV SOLN
INTRAVENOUS | Status: DC | PRN
  Administered 2023-06-08: 12 ug via INTRAVENOUS
  Administered 2023-06-08: 4 ug via INTRAVENOUS

## 2023-06-08 MED ORDER — CHLORHEXIDINE GLUCONATE 0.12 % MT SOLN
15.0000 mL | Freq: Once | OROMUCOSAL | Status: AC
  Administered 2023-06-08: 15 mL via OROMUCOSAL

## 2023-06-08 MED ORDER — ACETAMINOPHEN 10 MG/ML IV SOLN
INTRAVENOUS | Status: AC
Start: 2023-06-08 — End: ?
  Filled 2023-06-08: qty 100

## 2023-06-08 MED ORDER — FENTANYL CITRATE PF 50 MCG/ML IJ SOSY
25.0000 ug | PREFILLED_SYRINGE | INTRAMUSCULAR | Status: DC | PRN
  Administered 2023-06-08: 50 ug via INTRAVENOUS
  Filled 2023-06-08: qty 1

## 2023-06-08 MED ORDER — INDOCYANINE GREEN 25 MG IV SOLR
INTRAVENOUS | Status: AC
  Administered 2023-06-08: 2.5 mg via INTRAVENOUS
  Filled 2023-06-08: qty 10

## 2023-06-08 MED ORDER — CHLORHEXIDINE GLUCONATE CLOTH 2 % EX PADS: 6.0000 | MEDICATED_PAD | Freq: Once | CUTANEOUS | Status: DC

## 2023-06-08 SURGICAL SUPPLY — 36 items
CAUTERY HOOK MNPLR 1.6 DVNC XI (INSTRUMENTS) ×3 IMPLANT
CHLORAPREP W/TINT 26 (MISCELLANEOUS) ×3 IMPLANT
CLIP LIGATING HEM O LOK PURPLE (MISCELLANEOUS) ×3 IMPLANT
COVER LIGHT HANDLE STERIS (MISCELLANEOUS) ×3 IMPLANT
DERMABOND ADVANCED .7 DNX12 (GAUZE/BANDAGES/DRESSINGS) ×3 IMPLANT
DRAPE ARM DVNC X/XI (DISPOSABLE) ×12 IMPLANT
DRAPE COLUMN DVNC XI (DISPOSABLE) ×3 IMPLANT
ELECTRODE REM PT RTRN 9FT ADLT (ELECTROSURGICAL) ×3 IMPLANT
FORCEPS BPLR R/ABLATION 8 DVNC (INSTRUMENTS) ×3 IMPLANT
FORCEPS PROGRASP DVNC XI (FORCEP) ×3 IMPLANT
GLOVE BIOGEL PI IND STRL 7.0 (GLOVE) ×6 IMPLANT
GLOVE BIOGEL PI IND STRL 7.5 (GLOVE) IMPLANT
GLOVE SURG SS PI 7.0 STRL IVOR (GLOVE) IMPLANT
GLOVE SURG SS PI 7.5 STRL IVOR (GLOVE) ×6 IMPLANT
GOWN STRL REUS W/TWL LRG LVL3 (GOWN DISPOSABLE) ×9 IMPLANT
HEMOSTAT SNOW SURGICEL 2X4 (HEMOSTASIS) IMPLANT
IRRIGATOR SUCT 8 DISP DVNC XI (IRRIGATION / IRRIGATOR) IMPLANT
KIT TURNOVER KIT A (KITS) ×3 IMPLANT
MANIFOLD NEPTUNE II (INSTRUMENTS) ×3 IMPLANT
NDL HYPO 21X1.5 SAFETY (NEEDLE) ×3 IMPLANT
NDL INSUFFLATION 14GA 120MM (NEEDLE) ×3 IMPLANT
NEEDLE HYPO 21X1.5 SAFETY (NEEDLE) ×2 IMPLANT
NEEDLE INSUFFLATION 14GA 120MM (NEEDLE) ×2 IMPLANT
OBTURATOR OPTICALSTD 8 DVNC (TROCAR) ×3 IMPLANT
PACK LAP CHOLE LZT030E (CUSTOM PROCEDURE TRAY) ×3 IMPLANT
PAD ARMBOARD POSITIONER FOAM (MISCELLANEOUS) ×3 IMPLANT
PAD TELFA 3X4 1S STER (GAUZE/BANDAGES/DRESSINGS) IMPLANT
PENCIL HANDSWITCHING (ELECTRODE) ×3 IMPLANT
POSITIONER HEAD 8X9X4 ADT (SOFTGOODS) ×3 IMPLANT
SEAL UNIV 5-12 XI (MISCELLANEOUS) ×12 IMPLANT
SET BASIN LINEN APH (SET/KITS/TRAYS/PACK) ×3 IMPLANT
SET TUBE SMOKE EVAC HIGH FLOW (TUBING) ×3 IMPLANT
SUT MNCRL AB 4-0 PS2 18 (SUTURE) ×6 IMPLANT
SYR 30ML LL (SYRINGE) ×3 IMPLANT
SYSTEM RETRIEVL 5MM INZII UNIV (BASKET) ×3 IMPLANT
WATER STERILE IRR 500ML POUR (IV SOLUTION) ×3 IMPLANT

## 2023-06-08 NOTE — Op Note (Signed)
 Patient:  Brianna [name]  DOB:  October 14, 1970  MRN:  [id number]   Preop Diagnosis: Biliary colic, cholelithiasis, elevated liver enzyme tests  Postop Diagnosis: Same  Procedure: Robotic assisted laparoscopic cholecystectomy, wedge liver biopsy  Surgeon: Alanda Allegra, MD  Anes: General endotracheal  Indications: Patient is a 53 year old Hispanic female who presents with biliary colic secondary to cholelithiasis as well as elevated liver enzyme tests.  The risks and benefits of the procedure including bleeding, infection, hepatobiliary injury, and the possibility of an open procedure were fully explained to the patient, who gave informed consent.  Procedure note: The patient was placed in the supine position.  After induction of general endotracheal anesthesia, the abdomen was prepped and draped using the usual sterile technique with ChloraPrep.  Surgical site confirmation was performed.  An infraumbilical incision was made down to the fascia.  A Veress needle was introduced into the abdominal cavity and confirmation of placement was done using the saline drop test.  The abdomen was then insufflated to 15 mmHg pressure.  An 8 mm trocar was introduced into the abdominal cavity under direct visualization without difficulty.  The patient was placed in reverse Trendelenburg position and 8 mm trocars were placed in the left upper quadrant, right lower quadrant, and right flank regions.  The robot was then docked and targeted.  The liver was inspected and noted to be enlarged in nature.  The gallbladder was also noted to be intrahepatic.  A wedge resection of a portion of the right lobe of the liver was taken and sent to pathology for further examination.  The gallbladder was then retracted in a dynamic fashion in order to provide a critical view of the triangle of [place].  The cystic duct was first identified.  Its juncture to the infundibulum was fully identified.  This was confirmed by firefly.   Hem-o-lok clips were placed proximally and distally on the cystic duct and the cystic duct was divided.  The cystic artery was likewise ligated and divided.  The gallbladder was freed away from the gallbladder fossa using Bovie electrocautery.  Surgicel was placed in the gallbladder fossa.  Any fluid was evacuated from the abdominal cavity.  The gallbladder was removed using an Endo Catch bag without difficulty.  The robot was then docked and all air was evacuated from the abdominal cavity prior to the removal of the trocars.  All wounds were irrigated with normal saline.  All wounds were injected with 0.5% Sensorcaine.  All incisions were closed using a 4-0 Monocryl subcuticular suture.  Dermabond was applied.  All tape and needle counts were correct at the end of the procedure.  The patient was extubated in the operating room and transferred to PACU in stable condition.  Complications: None  EBL: Minimal  Specimen: Gallbladder, liver wedge

## 2023-06-08 NOTE — Transfer of Care (Signed)
 Immediate Anesthesia Transfer of Care Note  Patient: Brianna [name]  Procedure(s) Performed: CHOLECYSTECTOMY, ROBOT-ASSISTED, LAPAROSCOPIC (Abdomen) BIOPSY, LIVER (Liver)  Patient Location: PACU  Anesthesia Type:General  Level of Consciousness: drowsy and responds to stimulation  Airway & Oxygen Therapy: Patient Spontanous Breathing and Patient connected to face mask oxygen  Post-op Assessment: Report given to RN and Post -op Vital signs reviewed and stable  Post vital signs: Reviewed and stable  Last Vitals:  Vitals Value Taken Time  BP 91/51 06/08/23 0901  Temp    Pulse 83 06/08/23 0905  Resp 19 06/08/23 0906  SpO2 100 % 06/08/23 0905  Vitals shown include unfiled device data.  Last Pain:  Vitals:   06/08/23 0639  TempSrc: Oral  PainSc: 0-No pain      Patients Stated Pain Goal: 7 (06/08/23 4098)  Complications: There were no known notable events for this encounter.

## 2023-06-08 NOTE — Anesthesia Procedure Notes (Signed)
 Procedure Name: Intubation Date/Time: 06/08/2023 7:44 AM  Performed by: Coretha Dew, MDPre-anesthesia Checklist: Patient identified, Suction available, Emergency Drugs available, Patient being monitored and Timeout performed Patient Re-evaluated:Patient Re-evaluated prior to induction Oxygen Delivery Method: Circle system utilized Preoxygenation: Pre-oxygenation with 100% oxygen Induction Type: IV induction Ventilation: Mask ventilation without difficulty Laryngoscope Size: Miller and 2 Grade View: Grade I Tube type: Oral Tube size: 7.0 mm Number of attempts: 1 Airway Equipment and Method: Stylet and Patient positioned with wedge pillow Placement Confirmation: ETT inserted through vocal cords under direct vision, breath sounds checked- equal and bilateral and positive ETCO2 Secured at: 21 cm Tube secured with: Tape Dental Injury: Teeth and Oropharynx as per pre-operative assessment  Comments: Patient placed in optimal ramping position. Miller 2 with grade 3 view. Cricoid pressure applied and grade 1 view seen with successful tube placement.

## 2023-06-08 NOTE — Anesthesia Preprocedure Evaluation (Signed)
 Anesthesia Evaluation  Patient identified by MRN, date of birth, ID band Patient awake    Reviewed: Allergy & Precautions, H&P , NPO status , Patient's Chart, lab work & pertinent test results, reviewed documented beta blocker date and time   Airway Mallampati: II  TM Distance: >3 FB Neck ROM: full    Dental no notable dental hx.    Pulmonary neg pulmonary ROS   Pulmonary exam normal breath sounds clear to auscultation       Cardiovascular Exercise Tolerance: Good hypertension, negative cardio ROS  Rhythm:regular Rate:Normal     Neuro/Psych negative neurological ROS  negative psych ROS   GI/Hepatic negative GI ROS, Neg liver ROS,,,  Endo/Other  negative endocrine ROS    Renal/GU negative Renal ROS  negative genitourinary   Musculoskeletal   Abdominal   Peds  Hematology negative hematology ROS (+)   Anesthesia Other Findings   Reproductive/Obstetrics negative OB ROS                             Anesthesia Physical Anesthesia Plan  ASA: 2  Anesthesia Plan: General and General ETT   Post-op Pain Management:    Induction:   PONV Risk Score and Plan: Ondansetron  Airway Management Planned:   Additional Equipment:   Intra-op Plan:   Post-operative Plan:   Informed Consent: I have reviewed the patients History and Physical, chart, labs and discussed the procedure including the risks, benefits and alternatives for the proposed anesthesia with the patient or authorized representative who has indicated his/her understanding and acceptance.     Dental Advisory Given  Plan Discussed with: CRNA  Anesthesia Plan Comments:        Anesthesia Quick Evaluation

## 2023-06-08 NOTE — Interval H&P Note (Signed)
 History and Physical Interval Note:  06/08/2023 7:08 AM  Brianna [name]  has presented today for surgery, with the diagnosis of CHOLELITHIASIS.  The various methods of treatment have been discussed with the patient and family. After consideration of risks, benefits and other options for treatment, the patient has consented to  Procedure(s): CHOLECYSTECTOMY, ROBOT-ASSISTED, LAPAROSCOPIC (N/A) as a surgical intervention.  The patient's history has been reviewed, patient examined, no change in status, stable for surgery.  I have reviewed the patient's chart and labs.  Questions were answered to the patient's satisfaction.     Alanda Allegra

## 2023-06-09 IMAGING — CT CT RENAL STONE PROTOCOL
2 of 4 series · 16 of 46 positions shown, 18 images · non-contrast
Comparison: Pelvic ultrasound 07/16/2018

CLINICAL DATA: Flank pain, kidney stone suspected



[Series 2: axial st · axial · 0.77mm/px · z∈[-829,-394]mm · 13 of 99 slices shown, 15 images]
[im 6/99  soft-tissue]
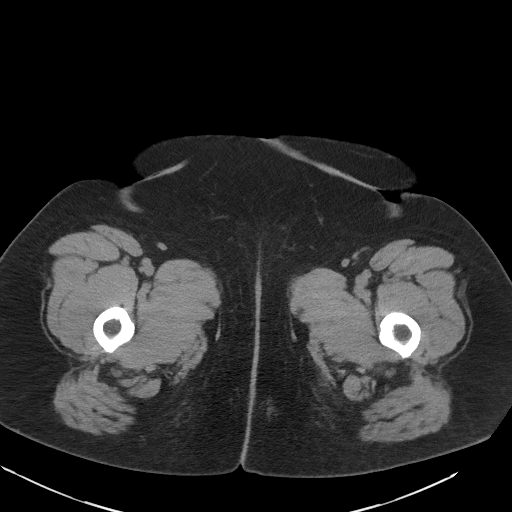
[im 6/99  bone]
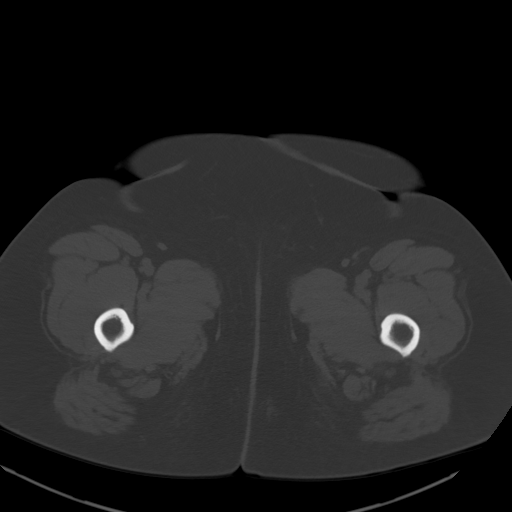
[im 16/99  soft-tissue]
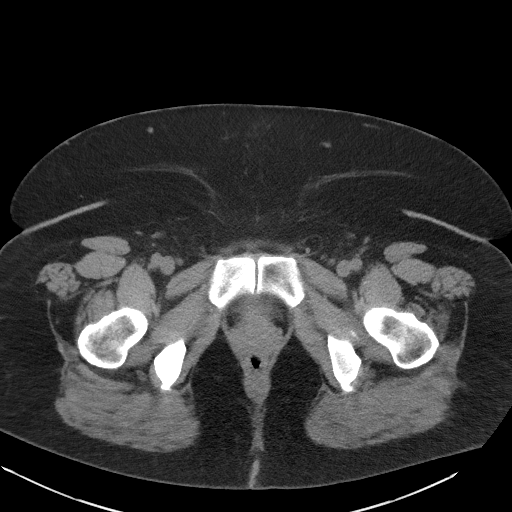
[im 21/99  soft-tissue]
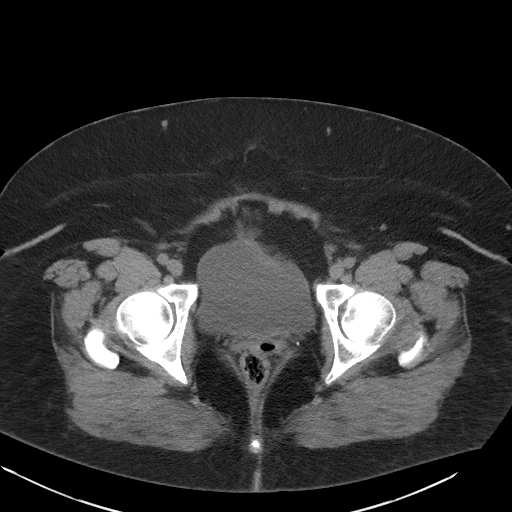
[im 26/99  soft-tissue]
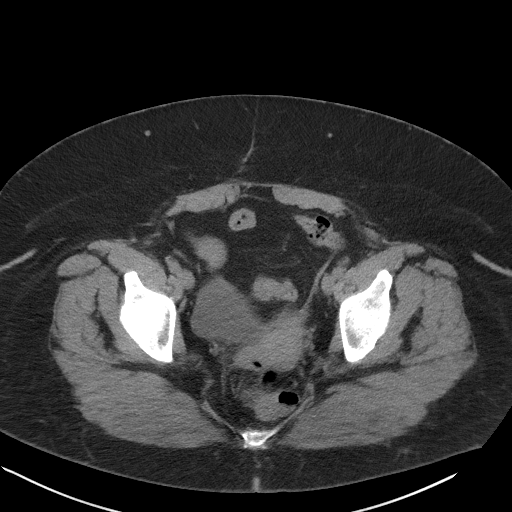
[im 37/99  soft-tissue]
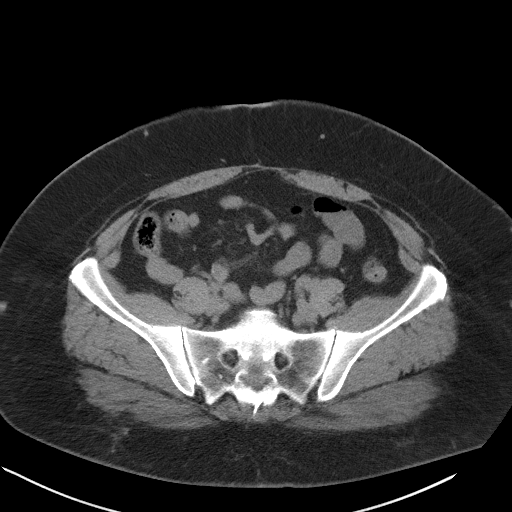
[im 42/99  soft-tissue]
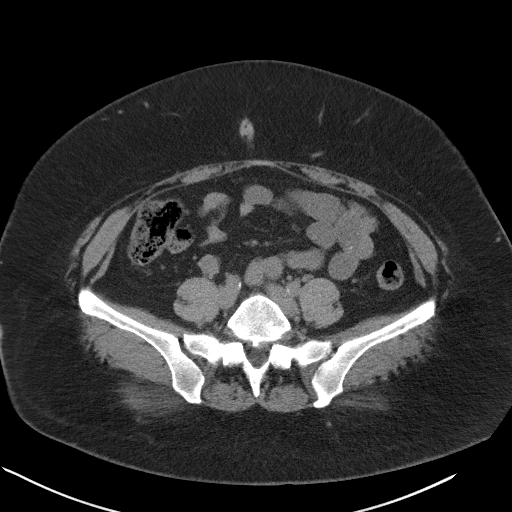
[im 52/99  soft-tissue]
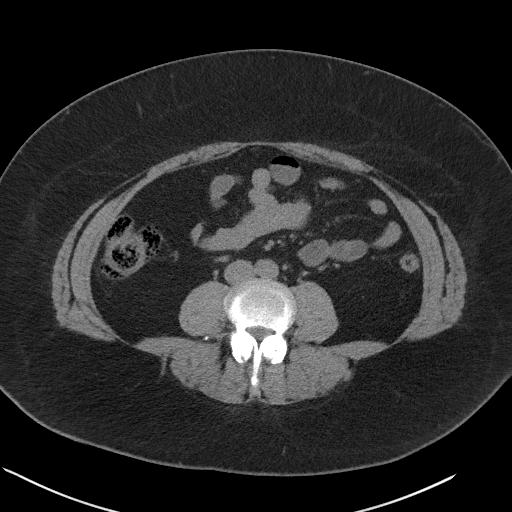
[im 57/99  soft-tissue]
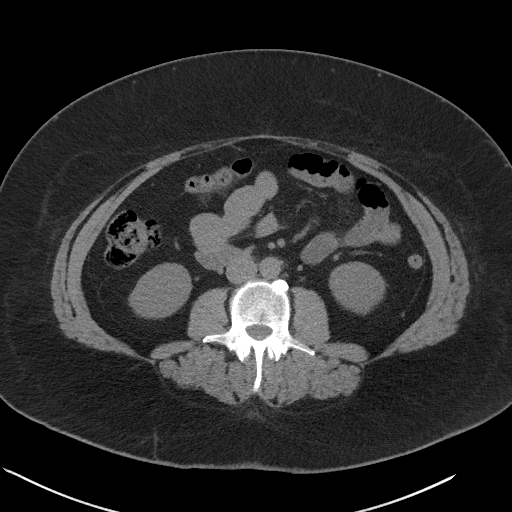
[im 62/99  soft-tissue]
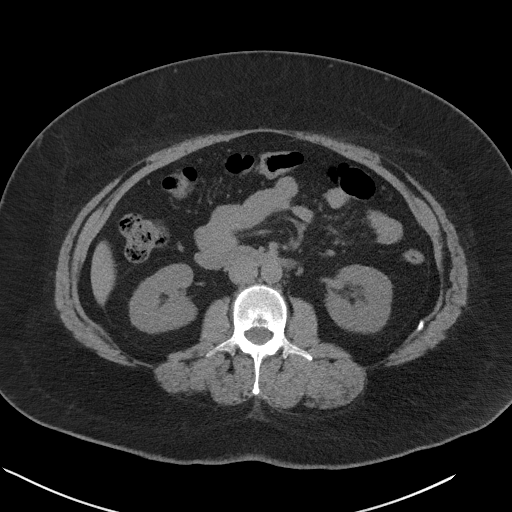
[im 62/99  bone]
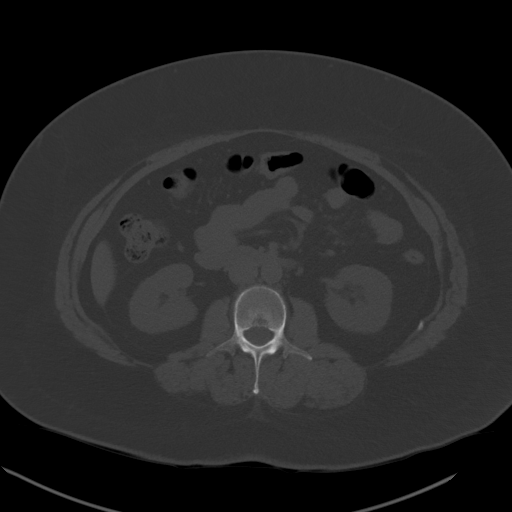
[im 73/99  soft-tissue]
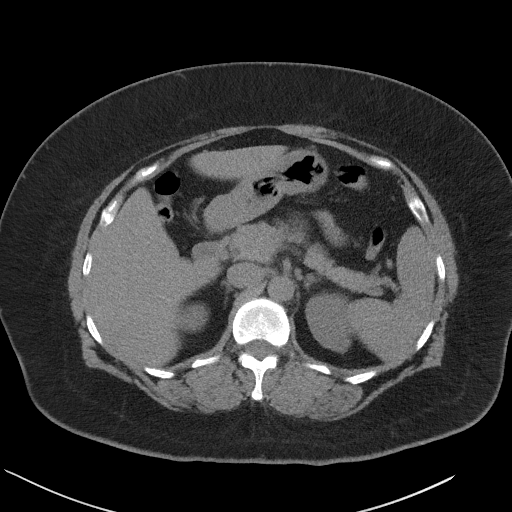
[im 78/99  soft-tissue]
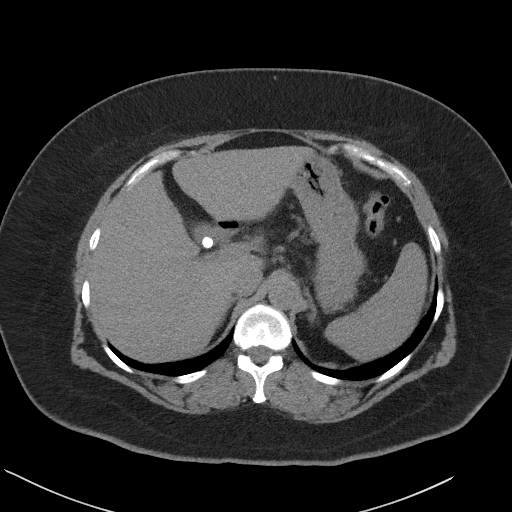
[im 83/99  soft-tissue]
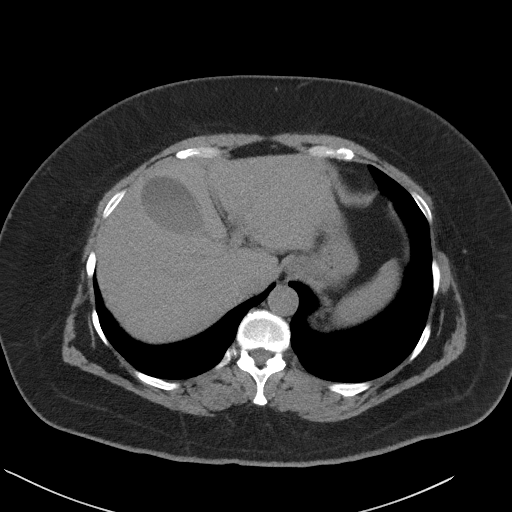
[im 93/99  soft-tissue]
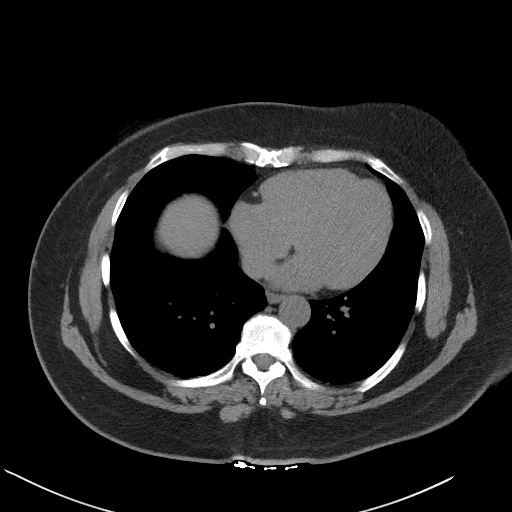

[Series 5: coronal st · coronal · 0.83mm/px · 3 of 115 slices shown]
[im 39/115  soft-tissue]
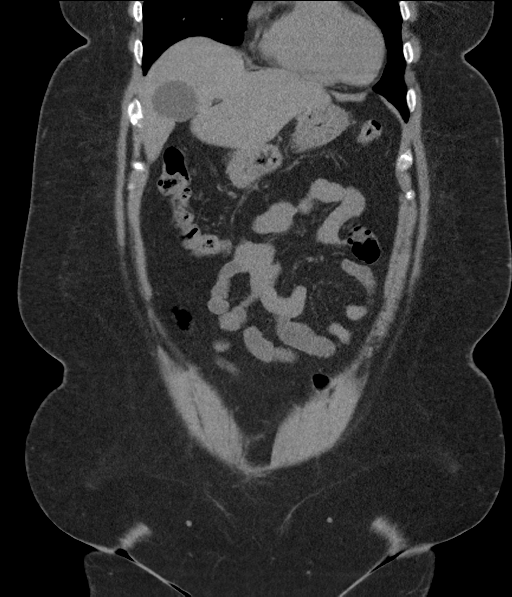
[im 51/115  soft-tissue]
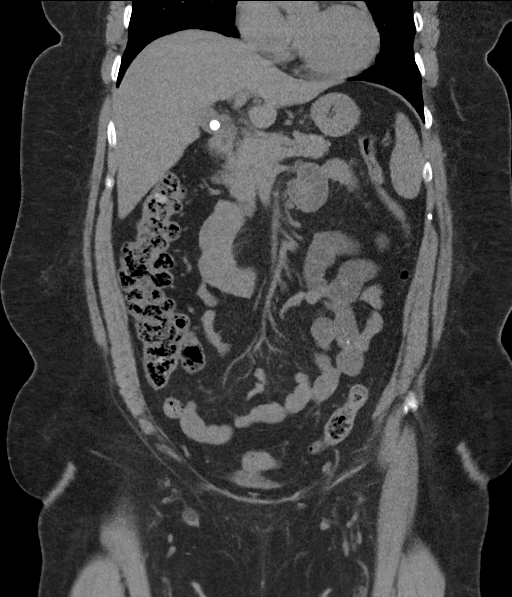
[im 64/115  soft-tissue]
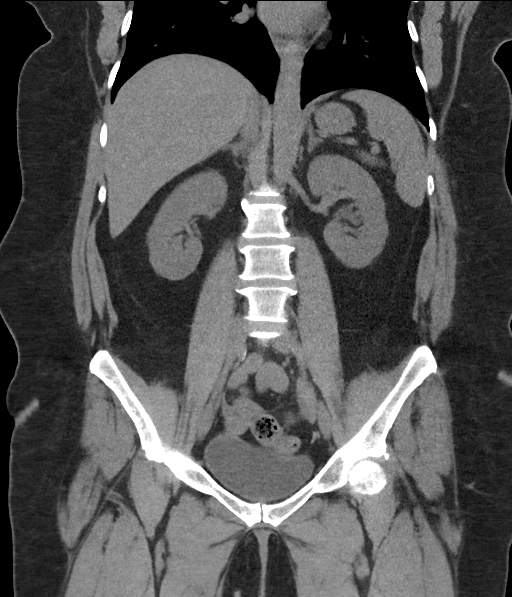

[16 of 46 positions shown; findings below may reference images not displayed]

FINDINGS: Lower chest: No acute abnormality.

Hepatobiliary: Normal noncontrast appearance of liver.
Peripherally-calcified 0.7 cm gallstone at the gallbladder fundus.
No gallbladder wall thickening, or biliary dilatation.

Pancreas: No pancreatic ductal dilatation or surrounding
inflammatory changes.

Spleen: Normal in size without focal abnormality.

Adrenals/Urinary Tract: Adrenal glands are unremarkable. Kidneys are
normal, without renal calculi, focal lesion, or hydronephrosis.
Bladder is unremarkable.

Stomach/Bowel: Stomach is within normal limits. Appendix appears
normal. No evidence of bowel wall thickening, distention, or
inflammatory changes.

Vascular/Lymphatic: Trace aortic atherosclerosis. No enlarged
abdominal or pelvic lymph nodes.

Reproductive: Uterus and bilateral adnexa are unremarkable.

Other: No abdominal wall hernia or abnormality. No abdominopelvic
ascites.

Musculoskeletal: No acute or significant osseous findings.
IMPRESSION: 1. No hydronephrosis or radiodense renal calculus.
2. Cholelithiasis. No additional findings to suggest acute
cholecystitis.

## 2023-06-10 NOTE — Anesthesia Postprocedure Evaluation (Signed)
 Anesthesia Post Note  Patient: Brianna [name]  Procedure(s) Performed: CHOLECYSTECTOMY, ROBOT-ASSISTED, LAPAROSCOPIC (Abdomen) BIOPSY, LIVER (Liver)  Patient location during evaluation: Phase II Anesthesia Type: General Level of consciousness: awake Pain management: pain level controlled Vital Signs Assessment: post-procedure vital signs reviewed and stable Respiratory status: spontaneous breathing and respiratory function stable Cardiovascular status: blood pressure returned to baseline and stable Postop Assessment: no headache and no apparent nausea or vomiting Anesthetic complications: no Comments: Late entry   There were no known notable events for this encounter.   Last Vitals:  Vitals:   06/08/23 0945 06/08/23 1002  BP: 116/75 108/62  Pulse:  87  Resp: (!) 24 20  Temp:  36.9 C  SpO2: 92% 96%    Last Pain:  Vitals:   06/08/23 1002  TempSrc: Oral  PainSc: 8                  Coretha Dew

## 2023-06-11 ENCOUNTER — Encounter (HOSPITAL_COMMUNITY): Payer: Self-pay | Admitting: General Surgery

## 2023-06-13 LAB — SURGICAL PATHOLOGY

## 2023-06-14 ENCOUNTER — Ambulatory Visit (INDEPENDENT_AMBULATORY_CARE_PROVIDER_SITE_OTHER): Admitting: General Surgery

## 2023-06-14 ENCOUNTER — Encounter: Payer: Self-pay | Admitting: General Surgery

## 2023-06-14 VITALS — BP 109/73 | HR 79 | Temp 98.2°F | Resp 12 | Ht 62.0 in | Wt 215.0 lb

## 2023-06-14 DIAGNOSIS — K7581 Nonalcoholic steatohepatitis (NASH): Secondary | ICD-10-CM

## 2023-06-14 DIAGNOSIS — Z09 Encounter for follow-up examination after completed treatment for conditions other than malignant neoplasm: Secondary | ICD-10-CM

## 2023-06-14 DIAGNOSIS — R748 Abnormal levels of other serum enzymes: Secondary | ICD-10-CM

## 2023-06-14 NOTE — Progress Notes (Signed)
 Subjective:     Brianna [name]  Patient here for postoperative visit, status post robotic assisted laparoscopic cholecystectomy with liver biopsy.  She is doing well.  She has minimal incisional pain along the right side of her abdomen.  She denies any nausea, vomiting, fever, or chills. Objective:    BP 109/73   Pulse 79   Temp 98.2 F (36.8 C) (Oral)   Resp 12   Ht 5\' 2"  (1.575 m)   Wt 215 lb (97.5 kg)   LMP 01/20/2015   SpO2 95%   BMI 39.32 kg/m   General:  alert, cooperative, and no distress  Abdomen soft, incisions healing well. Final pathology discussed with patient.   Results Surgical pathology (Order [id number]) MyChart Results Release  MyChart Status: Pending  Results Release   Surgical pathology Order: [id number]  Status: Edited Result - FINAL     Next appt: 06/26/2023 at 09:00 AM in Gastroenterology Delman Ferns, NP)   Test Result Released: No (inaccessible in MyChart)   0 Result Notes    Component Ref Range & Units (hover) 6 d ago  SURGICAL PATHOLOGY SURGICAL PATHOLOGY CASE: [id number] PATIENT: Brianna [name] Surgical Pathology Report     Clinical History: Cholelithiasis (las)     FINAL MICROSCOPIC DIAGNOSIS:  A. GALLBLADDER, CHOLECYSTECTOMY: - Chronic cholecystitis, cholesterolosis and cholelithiasis - Benign lymph node  B. LIVER, BIOPSY: - Mild to moderately active steatohepatitis (grade 1-2 of 3), see comment - Patchy mild centrilobular fibrosis (stage 0-1 of 4)    COMMENT:  The biopsy shows liver parenchyma with a preserved architecture. Hepatic lobules show mild steatosis (about 25%) with mild to moderate ballooning degeneration. Only isolated foci of necroinflammation are seen.  Portal tracts are mostly unremarkable with preserved biliary and vascular structures and minimal inflammation. Iron stain shows no abnormal iron accumulation. Trichrome and reticulin stains show patchy mild centrilobular chicken  wire-like fibrosis without any significant portal fibrosis. PASD stain shows no globular inclusions in hepatocytes. The findings are consistent with mild to moderately active steatohepatitis, alcoholic and/or metabolic dysfunction associated, with patchy mild fibrosis.    GROSS DESCRIPTION:  A. Specimen: received in formalin labeled with the patient's name and "Gallbladder" is a gallbladder. Integrity/Size: disrupted in the liver bed, 6.9 x up to 3.0 cm Serosal surface: pink-tan, slightly wrinkled, and glistening. Mucosa/Wall: a 0.2-0.4 cm thick, indurated wall surrounds green-tan, velvety mucosa with bright yellow speckling. Contents: a minimal amount of green, viscous bile engrosses a single1.0 cm green-black, multifaceted, friable calculus. Cystic duct: received clamped and 0.4 cm in diameter. Adjacent is a 1.0 cm red-tan, rubbery possible lymph node. Block Summary: 1 block, including margin (black)  B. Received in formalin labeled with the patients name and "Liver biopsy" is a 1.3 x 1.3 x 0.6 cm piece of yellow-brown, rubbery liver that is sectioned and entirely submitted in a single cassette.  (LEF 06/08/2023)   Final Diagnosis performed by Lee Public, MD.   Electronically signed 06/13/2023 Technical component performed at [facility], [address] [place], [place] [address].  Professional component performed at [facility], [address], [place], [place] [address].  Immunohistochemistry Technical component (if applicable) was performed at [facility]. [address], [address], [place], [place] [address].   IMMUNOHISTOCHEMISTRY DISCLAIMER (if applicable): Some of these immunohistochemical stains may have been developed and the performance characteristics determine by [facility]. Some may not have been cleared or approved by the U.S. Food and Drug Administration. The FDA has  determined that  such clearance or approval is not necessary. This test is used for clinical purposes. It should not be regarded as investigational or for research. This laboratory is certified under the Clinical Laboratory Improvement Amendments of 1988 (CLIA-88) as qualified to perform high complexity clinical laboratory testing.  The controls stained appropriately.   IHC stains are performed on formalin fixed, paraffin embedded tissue using a 3,3"diaminobenzidine (DAB) chromogen and Leica Bond Autostainer System. The staining intensity of the nucleus is score manually and is reported as the percentage of tumor cell nuclei demonstrating specific nuclear staining. The specimens are fixed in 10% Neutral Formalin for at least 6 hours and up to 72hrs. These tests are validated on decalcified tissue. Results should be interpreted with caution given the possibility of false negative results on decalcified specimens. Antibody Clones are as follows ER-clone 52F, PR-clone 16, Ki67- clone MM1. Some of these immunohistochemical stains may have been developed and the performance characteristics determined by [facility].  Resulting Agency [facility]              Assessment:    Doing well postoperatively. Liver biopsy results show active steatohepatitis    Plan:   Patient may resume normal activities.  Have referred patient to gastroenterology for further evaluation and treatment of the liver biopsy results.  Follow-up here as needed.

## 2023-06-26 ENCOUNTER — Ambulatory Visit (INDEPENDENT_AMBULATORY_CARE_PROVIDER_SITE_OTHER): Admitting: Gastroenterology

## 2023-06-26 ENCOUNTER — Encounter: Payer: Self-pay | Admitting: *Deleted

## 2023-06-26 ENCOUNTER — Other Ambulatory Visit (HOSPITAL_COMMUNITY)
Admission: RE | Admit: 2023-06-26 | Discharge: 2023-06-26 | Disposition: A | Discharge status: Home / Self Care | Source: Ambulatory Visit | Attending: Gastroenterology | Admitting: Gastroenterology

## 2023-06-26 ENCOUNTER — Encounter: Payer: Self-pay | Admitting: Gastroenterology

## 2023-06-26 VITALS — BP 123/82 | HR 79 | Temp 98.6°F | Ht 62.0 in | Wt 215.4 lb

## 2023-06-26 DIAGNOSIS — R7401 Elevation of levels of liver transaminase levels: Secondary | ICD-10-CM | POA: Diagnosis not present

## 2023-06-26 DIAGNOSIS — R748 Abnormal levels of other serum enzymes: Secondary | ICD-10-CM | POA: Insufficient documentation

## 2023-06-26 DIAGNOSIS — K760 Fatty (change of) liver, not elsewhere classified: Secondary | ICD-10-CM | POA: Insufficient documentation

## 2023-06-26 DIAGNOSIS — K7581 Nonalcoholic steatohepatitis (NASH): Secondary | ICD-10-CM

## 2023-06-26 LAB — HEPATITIS B SURFACE ANTIBODY,QUALITATIVE: Hep B S Ab: NONREACTIVE

## 2023-06-26 LAB — IRON AND TIBC
Iron: 111 ug/dL (ref 28–170)
Saturation Ratios: 31 % (ref 10.4–31.8)
TIBC: 357 ug/dL (ref 250–450)
UIBC: 246 ug/dL

## 2023-06-26 LAB — HEPATIC FUNCTION PANEL
ALT: 109 U/L — ABNORMAL HIGH (ref 0–44)
AST: 113 U/L — ABNORMAL HIGH (ref 15–41)
Albumin: 3.4 g/dL — ABNORMAL LOW (ref 3.5–5.0)
Alkaline Phosphatase: 132 U/L — ABNORMAL HIGH (ref 38–126)
Bilirubin, Direct: 0.2 mg/dL (ref 0.0–0.2)
Indirect Bilirubin: 0.7 mg/dL (ref 0.3–0.9)
Total Bilirubin: 0.9 mg/dL (ref 0.0–1.2)
Total Protein: 6.9 g/dL (ref 6.5–8.1)

## 2023-06-26 LAB — HEPATITIS A ANTIBODY, TOTAL: hep A Total Ab: REACTIVE — AB

## 2023-06-26 LAB — HEPATITIS B SURFACE ANTIGEN: Hepatitis B Surface Ag: NONREACTIVE

## 2023-06-26 LAB — FERRITIN: Ferritin: 183 ng/mL (ref 11–307)

## 2023-06-26 NOTE — Progress Notes (Unsigned)
 Gastroenterology Office Note    Referring Provider: Dr Bartlett [facility] Primary Care Physician:  Lemon Qua, NP  Primary GI: Dr. Mordechai April    Chief Complaint   Chief Complaint  Patient presents with   New Patient (Initial Visit)    Referred for steatohepatitis. Pt just feel a little sore     History of Present Illness   Brianna [name] is a 53 y.o. female presenting today at the request of Lemon Qua, NP due to    Liver biopsy May 2025: mild to moderately active steatohepatisis (Grade 1-2 of 3), fibrosis, stage 0/1 of 4.    5/30 lap chole. Had pain 5 years.   Soreness now but RUQ pain much improved. Symptoms had included nausea with certain types of food.    GERD: no.   Constipation in the past. Currently BM twice a day. Drinking lots of fluids. Some straining at times. No rectal bleeding.   No prior colonoscopy.   No FH colon polyps or cancer.   No known FH liver disease.   Sister, mother: diabetes.   State blood work ok with lipids when last done. Doesn't remember name of PCP. No personal history of diabetes.    HFP May 2025: AST 94, ALT 94, Alk Phos 131. Platelets 160.   Lives with mother. No children. Divorced.    Past Medical History:  Diagnosis Date   Ectopic pregnancy    History of kidney stones    Hypertension    PONV (postoperative nausea and vomiting)    Vaginal Pap smear, abnormal     Past Surgical History:  Procedure Laterality Date   ECTOPIC PREGNANCY SURGERY     x 2   EYE SURGERY Bilateral    LIVER BIOPSY N/A 06/08/2023   Procedure: BIOPSY, LIVER;  Surgeon: Alanda Allegra, MD;  Location: [facility];  Service: General;  Laterality: N/A;    Current Outpatient Medications  Medication Sig Dispense Refill   Cholecalciferol (VITAMIN D3) 125 MCG (5000 UT) TABS Take 5,000 Units by mouth daily.     ibuprofen (ADVIL) 800 MG tablet Take 800 mg by mouth every 8 (eight) hours as needed (pain).      lisinopril-hydrochlorothiazide (ZESTORETIC) 20-12.5 MG tablet Take 1 tablet by mouth in the morning.     Trospium  Chloride 60 MG CP24 Take 1 capsule (60 mg total) by mouth daily. 30 capsule 11   No current facility-administered medications for this visit.    Allergies as of 06/26/2023   (No Known Allergies)    Family History  Problem Relation Age of Onset   Stroke Father    Cancer Mother        stomach   Diabetes Brother    Hypertension Brother    Diabetes Sister    Hypertension Sister     Social History   Socioeconomic History   Marital status: Single    Spouse name: Not on file   Number of children: 0   Years of education: Not on file   Highest education level: 5th grade  Occupational History   Not on file  Tobacco Use   Smoking status: Never   Smokeless tobacco: Never  Vaping Use   Vaping status: Never Used  Substance and Sexual Activity   Alcohol use: Not Currently    Comment: socially   Drug use: Never   Sexual activity: Not Currently    Birth control/protection: Post-menopausal  Other Topics Concern   Not on file  Social  History Narrative   Not on file   Social Drivers of Health   Financial Resource Strain: Medium Risk (08/15/2021)   Overall Financial Resource Strain (CARDIA)    Difficulty of Paying Living Expenses: Somewhat hard  Food Insecurity: No Food Insecurity (11/25/2021)   Hunger Vital Sign    Worried About Running Out of Food in the Last Year: Never true    Ran Out of Food in the Last Year: Never true  Transportation Needs: No Transportation Needs (11/25/2021)   PRAPARE - Administrator, Civil Service (Medical): No    Lack of Transportation (Non-Medical): No  Physical Activity: Inactive (08/15/2021)   Exercise Vital Sign    Days of Exercise per Week: 0 days    Minutes of Exercise per Session: 0 min  Stress: No Stress Concern Present (08/15/2021)   [employer] of Occupational Health - Occupational Stress Questionnaire     Feeling of Stress : Only a little  Social Connections: Moderately Integrated (08/15/2021)   Social Connection and Isolation Panel    Frequency of Communication with Friends and Family: More than three times a week    Frequency of Social Gatherings with Friends and Family: Three times a week    Attends Religious Services: More than 4 times per year    Active Member of Clubs or Organizations: Yes    Attends Banker Meetings: Never    Marital Status: Separated  Intimate Partner Violence: Not At Risk (08/15/2021)   Humiliation, Afraid, Rape, and Kick questionnaire    Fear of Current or Ex-Partner: No    Emotionally Abused: No    Physically Abused: No    Sexually Abused: No     Review of Systems   Gen: Denies any fever, chills, fatigue, weight loss, lack of appetite.  CV: Denies chest pain, heart palpitations, peripheral edema, syncope.  Resp: Denies shortness of breath at rest or with exertion. Denies wheezing or cough.  GI: Denies dysphagia or odynophagia. Denies jaundice, hematemesis, fecal incontinence. GU : Denies urinary burning, urinary frequency, urinary hesitancy MS: Denies joint pain, muscle weakness, cramps, or limitation of movement.  Derm: Denies rash, itching, dry skin Psych: Denies depression, anxiety, memory loss, and confusion Heme: Denies bruising, bleeding, and enlarged lymph nodes.   Physical Exam   BP 123/82   Pulse 79   Temp 98.6 F (37 C)   Ht 5' 2 (1.575 m)   Wt 215 lb 6.4 oz (97.7 kg)   LMP 01/20/2015   BMI 39.40 kg/m  General:   Alert and oriented. Pleasant and cooperative. Well-nourished and well-developed.  Head:  Normocephalic and atraumatic. Eyes:  Without icterus Ears:  Normal auditory acuity. Lungs:  Clear to auscultation bilaterally.  Heart:  S1, S2 present without murmurs appreciated.  Abdomen:  +BS, soft, obese, ?HSM, TTP RUQ non-tender and non-distended. No HSM noted. No guarding or rebound. No masses appreciated.  Rectal:   Deferred  Msk:  Symmetrical without gross deformities. Normal posture. Extremities:  Without edema. Neurologic:  Alert and  oriented x4;  grossly normal neurologically. Skin:  Intact without significant lesions or rashes. Psych:  Alert and cooperative. Normal mood and affect.   Assessment   Brianna [name] is a 53 y.o. female presenting today with    PLAN     Delman Ferns, PhD, ANP-BC [facility]

## 2023-06-26 NOTE — Patient Instructions (Signed)
 I have ordered blood work for your liver.  We have also ordered an ultrasound of your liver!  Please review the handout.  I will see you back in 6-8 weeks!  It was a pleasure to see you today. I want to create trusting relationships with patients and provide genuine, compassionate, and quality care. I truly value your feedback, so please be on the lookout for a survey regarding your visit with me today. I appreciate your time in completing this!         Delman Ferns, PhD, ANP-BC [facility]

## 2023-06-27 LAB — GLIADIN ANTIBODIES, SERUM
Antigliadin Abs, IgA: 5 U (ref 0–19)
Gliadin IgG: 2 U (ref 0–19)

## 2023-06-27 LAB — HCV AB W REFLEX TO QUANT PCR: HCV Ab: NONREACTIVE

## 2023-06-27 LAB — IGG, IGA, IGM
IgA: 473 mg/dL — ABNORMAL HIGH (ref 87–352)
IgG (Immunoglobin G), Serum: 1380 mg/dL (ref 586–1602)
IgM (Immunoglobulin M), Srm: 105 mg/dL (ref 26–217)

## 2023-06-27 LAB — ANTI-SMOOTH MUSCLE ANTIBODY, IGG: F-Actin IgG: 8 U (ref 0–19)

## 2023-06-27 LAB — MISC LABCORP TEST (SEND OUT): Labcorp test code: 550960

## 2023-06-27 LAB — HCV INTERPRETATION

## 2023-06-27 LAB — MITOCHONDRIAL ANTIBODIES: Mitochondrial M2 Ab, IgG: <20 U (ref 0.0–20.0)

## 2023-06-27 LAB — ANA W/REFLEX IF POSITIVE: Anti Nuclear Antibody (ANA): NEGATIVE

## 2023-06-27 LAB — CERULOPLASMIN: Ceruloplasmin: 29.1 mg/dL (ref 19.0–39.0)

## 2023-06-28 ENCOUNTER — Ambulatory Visit (HOSPITAL_COMMUNITY)
Admission: RE | Admit: 2023-06-28 | Discharge: 2023-06-28 | Disposition: A | Discharge status: Home / Self Care | Source: Ambulatory Visit | Attending: Gastroenterology | Admitting: Gastroenterology

## 2023-06-28 DIAGNOSIS — K760 Fatty (change of) liver, not elsewhere classified: Secondary | ICD-10-CM | POA: Insufficient documentation

## 2023-06-28 DIAGNOSIS — R748 Abnormal levels of other serum enzymes: Secondary | ICD-10-CM | POA: Insufficient documentation

## 2023-06-28 LAB — TISSUE TRANSGLUTAMINASE, IGA: Tissue Transglutaminase Ab, IgA: <2 U/mL (ref 0–3)

## 2023-06-28 LAB — RETICULIN ANTIBODIES, IGA W TITER: Reticulin Ab, IgA: NEGATIVE {titer} (ref <2.5)

## 2023-06-29 LAB — MISC LABCORP TEST (SEND OUT): Labcorp test code: 550659

## 2023-07-02 ENCOUNTER — Ambulatory Visit: Payer: Self-pay | Admitting: Gastroenterology

## 2023-07-18 ENCOUNTER — Ambulatory Visit: Payer: Self-pay | Admitting: Gastroenterology

## 2023-07-27 ENCOUNTER — Encounter: Payer: Self-pay | Admitting: Urology

## 2023-07-27 ENCOUNTER — Ambulatory Visit (INDEPENDENT_AMBULATORY_CARE_PROVIDER_SITE_OTHER): Payer: Self-pay | Admitting: Urology

## 2023-07-27 VITALS — BP 141/79 | HR 83

## 2023-07-27 DIAGNOSIS — N393 Stress incontinence (female) (male): Secondary | ICD-10-CM

## 2023-07-27 DIAGNOSIS — R300 Dysuria: Secondary | ICD-10-CM

## 2023-07-27 DIAGNOSIS — N3281 Overactive bladder: Secondary | ICD-10-CM

## 2023-07-27 LAB — URINALYSIS, ROUTINE W REFLEX MICROSCOPIC
Bilirubin, UA: NEGATIVE
Glucose, UA: NEGATIVE
Ketones, UA: NEGATIVE
Nitrite, UA: NEGATIVE
Protein,UA: NEGATIVE
Specific Gravity, UA: 1.01 (ref 1.005–1.030)
Urobilinogen, Ur: 1 mg/dL (ref 0.2–1.0)
pH, UA: 6 (ref 5.0–7.5)

## 2023-07-27 LAB — MICROSCOPIC EXAMINATION: Bacteria, UA: NONE SEEN

## 2023-07-27 MED ORDER — MONISTAT 1 COMBO PACK 1200 & 2 MG & % VA KIT: 1.0000 | PACK | Freq: Once | VAGINAL | 0 refills | Status: AC

## 2023-07-27 MED ORDER — TOLTERODINE TARTRATE ER 4 MG PO CP24: 4.0000 mg | ORAL_CAPSULE | Freq: Every day | ORAL | 11 refills | Status: DC

## 2023-07-27 NOTE — Patient Instructions (Signed)
 Vejiga hiperactiva en adultos: qu debe saber Overactive Bladder in Adults: What to Know  La vejiga hiperactiva (VH) ocurre cuando uno tiene dificultad para contener la orina. Puede sentir la necesidad de Geographical information systems officer con frecuencia o de repente. Tambin puede tener fugas de orina si no puede llegar al bao con la rapidez suficiente. Estos sntomas pueden obstaculizar su vida diaria. Cules son las causas? La VH puede deberse a un problema en las seales nerviosas entre la vejiga y Secretary/administrator. La vejiga puede recibir la seal de vaciarse antes de que est llena. O bien, la vejiga puede ser demasiado sensible. Esto puede hacer que se comprima demasiado pronto. Otras causas son: Problemas mdicos, como: Una infeccin de las vas urinarias (IU). Hinchazn o clculos en la vejiga. Un tumor, que es un crecimiento de clulas que no es normal. Diabetes. Debilidad muscular o nerviosa. Las causas de esto pueden ser las siguientes: Una lesin en la mdula espinal. Un accidente cerebrovascular. Esclerosis mltiple. Ciruga en tejidos cercanos. Consumo de cafena o alcohol. Algunos medicamentos. Estos incluyen los que reducen la cantidad de lquido en el cuerpo. Dificultades para defecar, lo que tambin se llama estreimiento. Qu incrementa el riesgo? Usted puede correr ms riesgo si: Es Psychiatrist. Est atravesando la menopausia. Tiene la prstata grande o problemas con la prstata. Tiene sobrepeso. Fuma. Ingiere alimentos o bebidas que irritan la vejiga. Estos pueden incluir el t y los alimentos muy condimentados. Cules son los signos o sntomas? Necesidad repentina e intensa de Geographical information systems officer. Orinar 8 o ms veces al [employer]. Tener fugas de [place]. Levantarse para orinar 2 o ms veces por noche. Cmo se diagnostica? Se puede diagnosticar en funcin de los sntomas, los antecedentes mdicos y un examen. Tambin pueden hacerle pruebas. Pueden incluir: Anlisis de sangre. Pruebas de orina para  determinar si hay infeccin. Pruebas para estudiar el flujo de la [place]. Estas se llaman pruebas urodinmicas. Tambin es posible que deba consultar a un experto en problemas de la vejiga llamado urlogo. Cmo se trata? El tratamiento depende de la causa y de la gravedad de sus sntomas. Puede incluir lo siguiente: Entrenamiento de la vejiga, por ejemplo: Aprender a controlar la necesidad imperiosa de orinar al orinar a ciertas horas o momentos. Haga los ejercicios de [place]. Estos pueden ayudarlo a [employer] que inician y detienen el flujo de [place]. Dispositivos especiales, por ejemplo: Biorretroalimentacin. Este tratamiento [place] sensores para ayudarlo a aprender [employer]. Estimulacin elctrica. Este tratamiento [place] electricidad para [employer] nervios y los msculos de la vejiga funcionen. Un pesario. Esto puede introducirse en la vagina para sostener la vejiga. Medicamentos para: Warehouse manager una infeccin urinaria (IU). Evitar que la vejiga libere orina en el momento incorrecto. Calmar los msculos de la vejiga. Ciruga para: Ayudar a las [employer] que controlan la [place]. Cambiar la forma de la vejiga. Esto se realiza solamente en [employer] graves. Siga estas instrucciones en su casa: Comida y bebida  [employer] cambios en su dieta que le hayan indicado. Es posible que deba hacer lo siguiente: Product manager pequeas cantidades de lquido Freight forwarder de grandes cantidades de una vez. Consumir menos cafena o alcohol. Tomar medidas para tratar o prevenir los problemas para defecar. Posiblemente deba consumir alimentos ricos en fibra, como legumbres, cereales integrales, y frutas y verduras frescas. Estilo de vida Baje de [place], si es necesario. No fume, vapee ni consuma nicotina o tabaco. Instrucciones generales Tome los medicamentos nicamente segn las indicaciones. Si le han  recetado antibiticos, tmelos como se lo hayan indicado. No deje de tomarlos  aunque comience a sentirse mejor. Use los dispositivos como se lo hayan indicado. Si es necesario, use apsitos para absorber cualquier fuga de orina que pueda [place]. Lleve un registro de cunto bebe, cundo bebe y cundo aparece la necesidad de Geographical information systems officer. Comunquese con un mdico si: Los sntomas no mejoran con Scientist, research (medical). No puede controlar la vejiga. Tiene fiebre o siente escalofros. Esta informacin no tiene Theme park manager el consejo del mdico. Asegrese de hacerle al mdico cualquier pregunta que tenga. Document Revised: 10/23/2022 Document Reviewed: 10/23/2022 Elsevier Patient Education  2024 [employer].

## 2023-07-27 NOTE — Addendum Note (Signed)
 Addended by: Zelta Enfield L on: 07/27/2023 12:53 PM   Modules accepted: Orders

## 2023-07-27 NOTE — Progress Notes (Signed)
 07/27/2023 12:40 PM   Brianna [name] [id number] [id number]  Referring provider: Dasie Lew, NP [address] [place],  [place] [address]  Followup OAB   HPI: Brianna [name] is a 52yo here for followup for OAB. She was placed on trospium  60mg  daily last visit which improved her urge incontinence. She has mild SUI. She uses 3 pads per day which are damp.  She denies any straining to urinate. Urine stream strong.    PMH: Past Medical History:  Diagnosis Date   Ectopic pregnancy    History of kidney stones    Hypertension    PONV (postoperative nausea and vomiting)    Vaginal Pap smear, abnormal     Surgical History: Past Surgical History:  Procedure Laterality Date   ECTOPIC PREGNANCY SURGERY     x 2   EYE SURGERY Bilateral    LIVER BIOPSY N/A 06/08/2023   Procedure: BIOPSY, LIVER;  Surgeon: Mavis Anes, MD;  Location: [facility];  Service: General;  Laterality: N/A;    Home Medications:  Allergies as of 07/27/2023   No Known Allergies      Medication List        Accurate as of July 27, 2023 12:40 PM. If you have any questions, ask your nurse or doctor.          ibuprofen 800 MG tablet Commonly known as: ADVIL Take 800 mg by mouth every 8 (eight) hours as needed (pain).   lisinopril-hydrochlorothiazide 20-12.5 MG tablet Commonly known as: ZESTORETIC Take 1 tablet by mouth in the morning.   Trospium  Chloride 60 MG Cp24 Take 1 capsule (60 mg total) by mouth daily.   Vitamin D3 125 MCG (5000 UT) Tabs Take 5,000 Units by mouth daily.        Allergies: No Known Allergies  Family History: Family History  Problem Relation Age of Onset   Stroke Father    Cancer Mother        stomach   Diabetes Brother    Hypertension Brother    Diabetes Sister    Hypertension Sister     Social History:  reports that she has never smoked. She has never used smokeless tobacco. She reports that she does not currently use alcohol. She reports that she does not use  drugs.  ROS: All other review of systems were reviewed and are negative except what is noted above in HPI  Physical Exam: BP (!) 141/79   Pulse 83   LMP 01/20/2015   Constitutional:  Alert and oriented, No acute distress. HEENT: [place] AT, moist mucus membranes.  Trachea midline, no masses. Cardiovascular: No clubbing, cyanosis, or edema. Respiratory: Normal respiratory effort, no increased work of breathing. GI: Abdomen is soft, nontender, nondistended, no abdominal masses GU: No CVA tenderness.  Lymph: No cervical or inguinal lymphadenopathy. Skin: No rashes, bruises or suspicious lesions. Neurologic: Grossly intact, no focal deficits, moving all 4 extremities. Psychiatric: Normal mood and affect.  Laboratory Data: Lab Results  Component Value Date   WBC 4.8 06/06/2023   HGB 14.3 06/06/2023   HCT 42.0 06/06/2023   MCV 88.4 06/06/2023   PLT 160 06/06/2023    Lab Results  Component Value Date   CREATININE 0.56 06/06/2023    No results found for: PSA  No results found for: TESTOSTERONE  No results found for: HGBA1C  Urinalysis    Component Value Date/Time   APPEARANCEUR Clear 04/18/2023 1416   GLUCOSEU Negative 04/18/2023 1416   BILIRUBINUR Negative 04/18/2023 1416  PROTEINUR Negative 04/18/2023 1416   NITRITE Negative 04/18/2023 1416   LEUKOCYTESUR 1+ (A) 04/18/2023 1416    Lab Results  Component Value Date   LABMICR See below: 04/18/2023   WBCUA 11-30 (A) 04/18/2023   LABEPIT 0-10 04/18/2023   MUCUS Present 09/19/2021   BACTERIA None seen 04/18/2023    Pertinent Imaging:  No results found for this or any previous visit.  No results found for this or any previous visit.  No results found for this or any previous visit.  No results found for this or any previous visit.  No results found for this or any previous visit.  No results found for this or any previous visit.  No results found for this or any previous visit.  Results for orders  placed in visit on 05/16/21  CT RENAL STONE STUDY  Narrative CLINICAL DATA:  Flank pain, kidney stone suspected  EXAM: CT ABDOMEN AND PELVIS WITHOUT CONTRAST  TECHNIQUE: Multidetector CT imaging of the abdomen and pelvis was performed following the standard protocol without IV contrast.  RADIATION DOSE REDUCTION: This exam was performed according to the departmental dose-optimization program which includes automated exposure control, adjustment of the mA and/or kV according to patient size and/or use of iterative reconstruction technique.  COMPARISON:  Pelvic ultrasound 07/16/2018  FINDINGS: Lower chest: No acute abnormality.  Hepatobiliary: Normal noncontrast appearance of liver. Peripherally-calcified 0.7 cm gallstone at the gallbladder fundus. No gallbladder wall thickening, or biliary dilatation.  Pancreas: No pancreatic ductal dilatation or surrounding inflammatory changes.  Spleen: Normal in size without focal abnormality.  Adrenals/Urinary Tract: Adrenal glands are unremarkable. Kidneys are normal, without renal calculi, focal lesion, or hydronephrosis. Bladder is unremarkable.  Stomach/Bowel: Stomach is within normal limits. Appendix appears normal. No evidence of bowel wall thickening, distention, or inflammatory changes.  Vascular/Lymphatic: Trace aortic atherosclerosis. No enlarged abdominal or pelvic lymph nodes.  Reproductive: Uterus and bilateral adnexa are unremarkable.  Other: No abdominal wall hernia or abnormality. No abdominopelvic ascites.  Musculoskeletal: No acute or significant osseous findings.  IMPRESSION: 1. No hydronephrosis or radiodense renal calculus. 2. Cholelithiasis. No additional findings to suggest acute cholecystitis.   Electronically Signed By: Thom Hall M.D. On: 06/19/2021 19:18   Assessment & Plan:    1. OAB (overactive bladder) (Primary) We discussed PTNS, intravesical botox, sacral neuromodulation. After  discussing the options the patient elects to continue medical therapy. We will trial detrol LA 4mg   - Urinalysis, Routine w reflex microscopic   No follow-ups on file.  Belvie Clara, MD  [facility] [place]

## 2023-07-30 LAB — URINE CULTURE

## 2023-07-31 ENCOUNTER — Ambulatory Visit: Payer: Self-pay

## 2023-07-31 MED ORDER — SULFAMETHOXAZOLE-TRIMETHOPRIM 800-160 MG PO TABS: 1.0000 | ORAL_TABLET | Freq: Two times a day (BID) | ORAL | 0 refills | Status: DC

## 2023-07-31 NOTE — Telephone Encounter (Signed)
 Called patient through language line to inform patient of positive urine culture and antibiotic sent to pharmacy.

## 2023-07-31 NOTE — Telephone Encounter (Signed)
-----   Message from Belvie Clara sent at 07/31/2023  8:29 AM EDT ----- Please send bactrim  DS BID for 7 days ----- Message ----- From: Interface, [facility] Lab Results In Sent: 07/27/2023   3:36 PM EDT To: Belvie LITTIE Clara, MD

## 2023-08-21 ENCOUNTER — Ambulatory Visit: Payer: Self-pay | Admitting: Gastroenterology

## 2023-08-21 VITALS — BP 115/77 | HR 82 | Temp 98.4°F | Ht 62.0 in | Wt 218.0 lb

## 2023-08-21 DIAGNOSIS — K760 Fatty (change of) liver, not elsewhere classified: Secondary | ICD-10-CM

## 2023-08-21 DIAGNOSIS — Z1211 Encounter for screening for malignant neoplasm of colon: Secondary | ICD-10-CM

## 2023-08-21 DIAGNOSIS — R748 Abnormal levels of other serum enzymes: Secondary | ICD-10-CM

## 2023-08-21 NOTE — Progress Notes (Signed)
 Gastroenterology Office Note     Primary Care Physician:  Dasie Lew, NP  Primary Gastroenterologist: Dr. Cindie   Chief Complaint   Chief Complaint  Patient presents with   Follow-up    Follow up after US . Pt is doing somewhat better     History of Present Illness   Brianna [name] is a 53 y.o. female presenting today with a history of mild to moderately active steatohepatitis, fibrosis stage 0/I of 4 on liver biopsy, initially seen in June 2025 at the request of Dr. Mavis due to hepatic steatosis.  She has had extensive labs including serologies for autoimmune, PBC, iron overload, acute hepatitis serologies, ceruloplasmin, negative celiac panel.  Transaminases remain elevated.  She is here to discuss her ultrasound.  Spanish interpreter is present with her today.  Ultrasound in June 2025 with elastography was performed.  The data quality was not ideal and may not be totally accurate.  Mean K PA was 1.9.  She had no splenomegaly.  FibroSure showed F3 fibrosis, and ELF score was significantly elevated at 12.48. ASt and ALT are slightly increased from prior labs several weeks ago.  She is interested in interventions to assist with care of fatty liver.  She has no prior colonoscopy but she would like to do a Cologuard at this time.  She is also interested in seeing a nutritionist to help with dietary choices.  At times she may have sluggish bowel movements.  No overt GI bleeding.  No concerns with abdominal pain, reflux, dysphagia.  She is in the process of figuring out her insurance coverage and says she does not want to arrange anything just yet, but she is eager to do what is needed in the future once this is determined.  No prior colonoscopy.    No FH colon polyps or cancer.    No known FH liver disease.       Past Medical History:  Diagnosis Date   Ectopic pregnancy    History of kidney stones    Hypertension    PONV (postoperative nausea and vomiting)     Vaginal Pap smear, abnormal     Past Surgical History:  Procedure Laterality Date   ECTOPIC PREGNANCY SURGERY     x 2   EYE SURGERY Bilateral    LIVER BIOPSY N/A 06/08/2023   Procedure: BIOPSY, LIVER;  Surgeon: Mavis Anes, MD;  Location: [facility];  Service: General;  Laterality: N/A;    Current Outpatient Medications  Medication Sig Dispense Refill   Cholecalciferol (VITAMIN D3) 125 MCG (5000 UT) TABS Take 5,000 Units by mouth daily.     ibuprofen (ADVIL) 800 MG tablet Take 800 mg by mouth every 8 (eight) hours as needed (pain).     lisinopril-hydrochlorothiazide (ZESTORETIC) 20-12.5 MG tablet Take 1 tablet by mouth in the morning.     tolterodine  (DETROL  LA) 4 MG 24 hr capsule Take 1 capsule (4 mg total) by mouth daily. 30 capsule 11   Trospium  Chloride 60 MG CP24 Take 1 capsule (60 mg total) by mouth daily. 30 capsule 11   No current facility-administered medications for this visit.    Allergies as of 08/21/2023   (No Known Allergies)    Family History  Problem Relation Age of Onset   Stroke Father    Cancer Mother        stomach   Diabetes Brother    Hypertension Brother    Diabetes Sister    Hypertension Sister  Social History   Socioeconomic History   Marital status: Single    Spouse name: Not on file   Number of children: 0   Years of education: Not on file   Highest education level: 5th grade  Occupational History   Not on file  Tobacco Use   Smoking status: Never   Smokeless tobacco: Never  Vaping Use   Vaping status: Never Used  Substance and Sexual Activity   Alcohol use: Not Currently    Comment: socially   Drug use: Never   Sexual activity: Not Currently    Birth control/protection: Post-menopausal  Other Topics Concern   Not on file  Social History Narrative   Not on file   Social Drivers of Health   Financial Resource Strain: Medium Risk (08/15/2021)   Overall Financial Resource Strain (CARDIA)    Difficulty of Paying Living  Expenses: Somewhat hard  Food Insecurity: No Food Insecurity (11/25/2021)   Hunger Vital Sign    Worried About Running Out of Food in the Last Year: Never true    Ran Out of Food in the Last Year: Never true  Transportation Needs: No Transportation Needs (11/25/2021)   PRAPARE - Administrator, Civil Service (Medical): No    Lack of Transportation (Non-Medical): No  Physical Activity: Inactive (08/15/2021)   Exercise Vital Sign    Days of Exercise per Week: 0 days    Minutes of Exercise per Session: 0 min  Stress: No Stress Concern Present (08/15/2021)   [employer] of Occupational Health - Occupational Stress Questionnaire    Feeling of Stress : Only a little  Social Connections: Moderately Integrated (08/15/2021)   Social Connection and Isolation Panel    Frequency of Communication with Friends and Family: More than three times a week    Frequency of Social Gatherings with Friends and Family: Three times a week    Attends Religious Services: More than 4 times per year    Active Member of Clubs or Organizations: Yes    Attends Banker Meetings: Never    Marital Status: Separated  Intimate Partner Violence: Not At Risk (08/15/2021)   Humiliation, Afraid, Rape, and Kick questionnaire    Fear of Current or Ex-Partner: No    Emotionally Abused: No    Physically Abused: No    Sexually Abused: No     Review of Systems   Gen: Denies any fever, chills, fatigue, weight loss, lack of appetite.  CV: Denies chest pain, heart palpitations, peripheral edema, syncope.  Resp: Denies shortness of breath at rest or with exertion. Denies wheezing or cough.  GI: Denies dysphagia or odynophagia. Denies jaundice, hematemesis, fecal incontinence. GU : Denies urinary burning, urinary frequency, urinary hesitancy MS: Denies joint pain, muscle weakness, cramps, or limitation of movement.  Derm: Denies rash, itching, dry skin Psych: Denies depression, anxiety, memory loss, and  confusion Heme: Denies bruising, bleeding, and enlarged lymph nodes.   Physical Exam   BP 115/77   Pulse 82   Temp 98.4 F (36.9 C)   Ht 5' 2 (1.575 m)   Wt 218 lb (98.9 kg)   LMP 01/20/2015   BMI 39.87 kg/m  General:   Alert and oriented. Pleasant and cooperative. Well-nourished and well-developed.  Head:  Normocephalic and atraumatic. Eyes:  Without icterus Abdomen:  +BS, soft, non-tender and non-distended. No HSM noted. No guarding or rebound. No masses appreciated.  Rectal:  Deferred  Msk:  Symmetrical without gross deformities. Normal posture. Extremities:  Without edema. Neurologic:  Alert and  oriented x4;  grossly normal neurologically. Skin:  Intact without significant lesions or rashes. Psych:  Alert and cooperative. Normal mood and affect.   Assessment   Brianna [name] is a 53 y.o. 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  Evaluation so far has been negative for autoimmune or PBC, no iron overload, no chronic hep C or hep B, negative celiac panel, and ceruloplasmin was normal.  Elastography showed a very low K PA but the data quality was not ideal.  Her FibroSure did show F3 fibrosis and her elf score was high at 12.48.  Likely we are dealing with MASH and documented fibrosis, however her transaminases continue to increase.  We will need to update these now.  We may ultimately need to do an MRI/MRCP to evaluate biliary tree.  If this is negative for any concerns, we could certainly consider starting Rezdiffra, as she has no signs of portal hypertension, and her platelet count is normal.  In addition, we do have a liver biopsy on file, that does not show cirrhosis.  However, I do know that this could be subject to sampling quality.  Overall, she does not appear to have cirrhosis, but she is definitely at risk for progression of this.   PLAN    Update LFTs.   After review of this, will likely need MRI to further wrap-up liver evaluation. Referral to nutritionist Cologuard ordered May take MiraLAX daily as needed for constipation Return in 3 months regardless   Therisa MICAEL Stager, PhD, ANP-BC [facility]astroenter[facility]

## 2023-08-21 NOTE — Patient Instructions (Addendum)
 Por favor, hgase un anlisis de [employer] cuando pueda.  Hemos solicitado la prueba Cologuard. Se la enviarn a su domicilio para que la complete.  La hemos derivado a un nutricionista para que le ayude a elegir una buena alimentacin.  Recomiendo tomar una tapa de Miralax al da segn sea necesario para una buena evacuacin intestinal.  Nos vemos en 3 meses! Es posible que tengamos que solicitar otra prueba para evaluar su hgado con ms detalle despus de revisar los anlisis de [place].  Fue Oncologist a verla hoy! Valoro nuestra relacin y quiero brindarle una atencin genuina, compasiva y de calidad. Es posible que reciba una encuesta sobre su visita y agradezco sus comentarios. Muchas gracias por tomarse el tiempo para completarla. Espero volver a verla.  Therisa MICAEL Stager, PhD, ANP-BC [facility]        Please have blood work at the hospital when you are able.  We have ordered the Cologuard test. They will send this to your house to complete.   We have referred you to a nutritionist to help with good food choices.  I recommend taking Miralax one capful daily as needed for good bowel movements.  We will see you in 3 months! We may need to order another test to further evaluate your liver after review of blood work.   I enjoyed seeing you again today! I value our relationship and want to provide genuine, compassionate, and quality care. You may receive a survey regarding your visit with me, and I welcome your feedback! Thanks so much for taking the time to complete this. I look forward to seeing you again.      Therisa MICAEL Stager, PhD, ANP-BC [facility]astroenter[facility]

## 2023-08-28 ENCOUNTER — Other Ambulatory Visit (HOSPITAL_COMMUNITY)
Admission: RE | Admit: 2023-08-28 | Discharge: 2023-08-28 | Disposition: A | Discharge status: Home / Self Care | Payer: Self-pay | Source: Ambulatory Visit | Attending: Gastroenterology | Admitting: Gastroenterology

## 2023-08-28 DIAGNOSIS — K760 Fatty (change of) liver, not elsewhere classified: Secondary | ICD-10-CM | POA: Insufficient documentation

## 2023-08-28 LAB — HEPATIC FUNCTION PANEL
ALT: 72 U/L — ABNORMAL HIGH (ref 0–44)
AST: 86 U/L — ABNORMAL HIGH (ref 15–41)
Albumin: 3.4 g/dL — ABNORMAL LOW (ref 3.5–5.0)
Alkaline Phosphatase: 188 U/L — ABNORMAL HIGH (ref 38–126)
Bilirubin, Direct: 0.1 mg/dL (ref 0.0–0.2)
Indirect Bilirubin: 0.7 mg/dL (ref 0.3–0.9)
Total Bilirubin: 0.8 mg/dL (ref 0.0–1.2)
Total Protein: 7.1 g/dL (ref 6.5–8.1)

## 2023-09-18 ENCOUNTER — Ambulatory Visit: Payer: Self-pay | Admitting: Gastroenterology

## 2023-10-01 ENCOUNTER — Other Ambulatory Visit: Payer: Self-pay | Admitting: *Deleted

## 2023-10-01 DIAGNOSIS — R748 Abnormal levels of other serum enzymes: Secondary | ICD-10-CM

## 2023-10-23 ENCOUNTER — Other Ambulatory Visit: Payer: Self-pay | Admitting: *Deleted

## 2023-10-23 DIAGNOSIS — R748 Abnormal levels of other serum enzymes: Secondary | ICD-10-CM

## 2023-10-23 NOTE — Progress Notes (Signed)
 error

## 2023-10-24 ENCOUNTER — Ambulatory Visit (INDEPENDENT_AMBULATORY_CARE_PROVIDER_SITE_OTHER): Admitting: Urology

## 2023-10-24 ENCOUNTER — Encounter: Payer: Self-pay | Admitting: Urology

## 2023-10-24 VITALS — BP 120/71 | HR 82

## 2023-10-24 DIAGNOSIS — N3001 Acute cystitis with hematuria: Secondary | ICD-10-CM

## 2023-10-24 DIAGNOSIS — N3281 Overactive bladder: Secondary | ICD-10-CM | POA: Diagnosis not present

## 2023-10-24 DIAGNOSIS — R300 Dysuria: Secondary | ICD-10-CM

## 2023-10-24 LAB — URINALYSIS, ROUTINE W REFLEX MICROSCOPIC
Bilirubin, UA: NEGATIVE
Glucose, UA: NEGATIVE
Ketones, UA: NEGATIVE
Nitrite, UA: NEGATIVE
Protein,UA: NEGATIVE
Specific Gravity, UA: 1.015 (ref 1.005–1.030)
Urobilinogen, Ur: 1 mg/dL (ref 0.2–1.0)
pH, UA: 6.5 (ref 5.0–7.5)

## 2023-10-24 LAB — MICROSCOPIC EXAMINATION: WBC, UA: >30 /HPF — AB (ref 0–5)

## 2023-10-24 MED ORDER — TOLTERODINE TARTRATE ER 4 MG PO CP24: 4.0000 mg | ORAL_CAPSULE | Freq: Every day | ORAL | 11 refills | Status: AC

## 2023-10-24 MED ORDER — NITROFURANTOIN MONOHYD MACRO 100 MG PO CAPS: 100.0000 mg | ORAL_CAPSULE | Freq: Two times a day (BID) | ORAL | 0 refills | Status: DC

## 2023-10-24 NOTE — Patient Instructions (Signed)
 Vejiga hiperactiva en adultos: qu debe saber Overactive Bladder in Adults: What to Know  La vejiga hiperactiva (VH) ocurre cuando uno tiene dificultad para contener la orina. Puede sentir la necesidad de Geographical information systems officer con frecuencia o de repente. Tambin puede tener fugas de orina si no puede llegar al bao con la rapidez suficiente. Estos sntomas pueden obstaculizar su vida diaria. Cules son las causas? La VH puede deberse a un problema en las seales nerviosas entre la vejiga y Secretary/administrator. La vejiga puede recibir la seal de vaciarse antes de que est llena. O bien, la vejiga puede ser demasiado sensible. Esto puede hacer que se comprima demasiado pronto. Otras causas son: Problemas mdicos, como: Una infeccin de las vas urinarias (IU). Hinchazn o clculos en la vejiga. Un tumor, que es un crecimiento de clulas que no es normal. Diabetes. Debilidad muscular o nerviosa. Las causas de esto pueden ser las siguientes: Una lesin en la mdula espinal. Un accidente cerebrovascular. Esclerosis mltiple. Ciruga en tejidos cercanos. Consumo de cafena o alcohol. Algunos medicamentos. Estos incluyen los que reducen la cantidad de lquido en el cuerpo. Dificultades para defecar, lo que tambin se llama estreimiento. Qu incrementa el riesgo? Usted puede correr ms riesgo si: Es Psychiatrist. Est atravesando la menopausia. Tiene la prstata grande o problemas con la prstata. Tiene sobrepeso. Fuma. Ingiere alimentos o bebidas que irritan la vejiga. Estos pueden incluir el t y los alimentos muy condimentados. Cules son los signos o sntomas? Necesidad repentina e intensa de Geographical information systems officer. Orinar 8 o ms veces al [employer]. Tener fugas de [place]. Levantarse para orinar 2 o ms veces por noche. Cmo se diagnostica? Se puede diagnosticar en funcin de los sntomas, los antecedentes mdicos y un examen. Tambin pueden hacerle pruebas. Pueden incluir: Anlisis de sangre. Pruebas de orina para  determinar si hay infeccin. Pruebas para estudiar el flujo de la [place]. Estas se llaman pruebas urodinmicas. Tambin es posible que deba consultar a un experto en problemas de la vejiga llamado urlogo. Cmo se trata? El tratamiento depende de la causa y de la gravedad de sus sntomas. Puede incluir lo siguiente: Entrenamiento de la vejiga, por ejemplo: Aprender a controlar la necesidad imperiosa de orinar al orinar a ciertas horas o momentos. Haga los ejercicios de [place]. Estos pueden ayudarlo a [employer] que inician y detienen el flujo de [place]. Dispositivos especiales, por ejemplo: Biorretroalimentacin. Este tratamiento [place] sensores para ayudarlo a aprender [employer]. Estimulacin elctrica. Este tratamiento [place] electricidad para [employer] nervios y los msculos de la vejiga funcionen. Un pesario. Esto puede introducirse en la vagina para sostener la vejiga. Medicamentos para: Warehouse manager una infeccin urinaria (IU). Evitar que la vejiga libere orina en el momento incorrecto. Calmar los msculos de la vejiga. Ciruga para: Ayudar a las [employer] que controlan la [place]. Cambiar la forma de la vejiga. Esto se realiza solamente en [employer] graves. Siga estas instrucciones en su casa: Comida y bebida  [employer] cambios en su dieta que le hayan indicado. Es posible que deba hacer lo siguiente: Product manager pequeas cantidades de lquido Freight forwarder de grandes cantidades de una vez. Consumir menos cafena o alcohol. Tomar medidas para tratar o prevenir los problemas para defecar. Posiblemente deba consumir alimentos ricos en fibra, como legumbres, cereales integrales, y frutas y verduras frescas. Estilo de vida Baje de [place], si es necesario. No fume, vapee ni consuma nicotina o tabaco. Instrucciones generales Tome los medicamentos nicamente segn las indicaciones. Si le han  recetado antibiticos, tmelos como se lo hayan indicado. No deje de tomarlos  aunque comience a sentirse mejor. Use los dispositivos como se lo hayan indicado. Si es necesario, use apsitos para absorber cualquier fuga de orina que pueda [place]. Lleve un registro de cunto bebe, cundo bebe y cundo aparece la necesidad de Geographical information systems officer. Comunquese con un mdico si: Los sntomas no mejoran con Scientist, research (medical). No puede controlar la vejiga. Tiene fiebre o siente escalofros. Esta informacin no tiene Theme park manager el consejo del mdico. Asegrese de hacerle al mdico cualquier pregunta que tenga. Document Revised: 10/23/2022 Document Reviewed: 10/23/2022 Elsevier Patient Education  2024 [employer].

## 2023-10-24 NOTE — Progress Notes (Signed)
 10/24/2023 10:51 AM   Brianna [name] 08-29-1970 [id number]  Referring provider: Dasie Lew, NP [address] [place],  [place] [address]  dysuria   HPI: Brianna [name] is a 53yo here for followup for OAB. She was placed on detrol  last visit. For the past month she has noted urinary frequency, urgency and dysuria. She uses 3 pads per day which are soaked. She was unable to get detrol  due to insurance.    PMH: Past Medical History:  Diagnosis Date   Ectopic pregnancy    History of kidney stones    Hypertension    PONV (postoperative nausea and vomiting)    Vaginal Pap smear, abnormal     Surgical History: Past Surgical History:  Procedure Laterality Date   ECTOPIC PREGNANCY SURGERY     x 2   EYE SURGERY Bilateral    LIVER BIOPSY N/A 06/08/2023   Procedure: BIOPSY, LIVER;  Surgeon: Brianna Anes, MD;  Location: [facility];  Service: General;  Laterality: N/A;    Home Medications:  Allergies as of 10/24/2023   No Known Allergies      Medication List        Accurate as of October 24, 2023 10:51 AM. If you have any questions, ask your nurse or doctor.          ibuprofen 800 MG tablet Commonly known as: ADVIL Take 800 mg by mouth every 8 (eight) hours as needed (pain).   lisinopril-hydrochlorothiazide 20-12.5 MG tablet Commonly known as: ZESTORETIC Take 1 tablet by mouth in the morning.   tolterodine  4 MG 24 hr capsule Commonly known as: DETROL  LA Take 1 capsule (4 mg total) by mouth daily.   Trospium  Chloride 60 MG Cp24 Take 1 capsule (60 mg total) by mouth daily.   Vitamin D3 125 MCG (5000 UT) Tabs Take 5,000 Units by mouth daily.        Allergies: No Known Allergies  Family History: Family History  Problem Relation Age of Onset   Stroke Father    Cancer Mother        stomach   Diabetes Brother    Hypertension Brother    Diabetes Sister    Hypertension Sister     Social History:  reports that she has never smoked. She has never used  smokeless tobacco. She reports that she does not currently use alcohol. She reports that she does not use drugs.  ROS: All other review of systems were reviewed and are negative except what is noted above in HPI  Physical Exam: BP 120/71   Pulse 82   LMP 01/20/2015   Constitutional:  Alert and oriented, No acute distress. HEENT: Brianna [name], moist mucus membranes.  Trachea midline, no masses. Cardiovascular: No clubbing, cyanosis, or edema. Respiratory: Normal respiratory effort, no increased work of breathing. GI: Abdomen is soft, nontender, nondistended, no abdominal masses GU: No CVA tenderness.  Lymph: No cervical or inguinal lymphadenopathy. Skin: No rashes, bruises or suspicious lesions. Neurologic: Grossly intact, no focal deficits, moving all 4 extremities. Psychiatric: Normal mood and affect.  Laboratory Data: Lab Results  Component Value Date   WBC 4.8 06/06/2023   HGB 14.3 06/06/2023   HCT 42.0 06/06/2023   MCV 88.4 06/06/2023   PLT 160 06/06/2023    Lab Results  Component Value Date   CREATININE 0.56 06/06/2023    No results found for: PSA  No results found for: TESTOSTERONE  No results found for: HGBA1C  Urinalysis    Component Value Date/Time  APPEARANCEUR Clear 07/27/2023 1244   GLUCOSEU Negative 07/27/2023 1244   BILIRUBINUR Negative 07/27/2023 1244   PROTEINUR Negative 07/27/2023 1244   NITRITE Negative 07/27/2023 1244   LEUKOCYTESUR Trace (A) 07/27/2023 1244    Lab Results  Component Value Date   LABMICR See below: 07/27/2023   WBCUA 0-5 07/27/2023   LABEPIT 0-10 07/27/2023   MUCUS Present 09/19/2021   BACTERIA None seen 07/27/2023    Pertinent Imaging:  No results found for this or any previous visit.  No results found for this or any previous visit.  No results found for this or any previous visit.  No results found for this or any previous visit.  No results found for this or any previous visit.  No results found for this  or any previous visit.  No results found for this or any previous visit.  Results for orders placed in visit on 05/16/21  CT RENAL STONE STUDY  Narrative CLINICAL DATA:  Flank pain, kidney stone suspected  EXAM: CT ABDOMEN AND PELVIS WITHOUT CONTRAST  TECHNIQUE: Multidetector CT imaging of the abdomen and pelvis was performed following the standard protocol without IV contrast.  RADIATION DOSE REDUCTION: This exam was performed according to the departmental dose-optimization program which includes automated exposure control, adjustment of the mA and/or kV according to patient size and/or use of iterative reconstruction technique.  COMPARISON:  Pelvic ultrasound 07/16/2018  FINDINGS: Lower chest: No acute abnormality.  Hepatobiliary: Normal noncontrast appearance of liver. Peripherally-calcified 0.7 cm gallstone [name] the gallbladder fundus. No gallbladder wall thickening, or biliary dilatation.  Pancreas: No pancreatic ductal dilatation or surrounding inflammatory changes.  Spleen: Normal in size without focal abnormality.  Adrenals/Urinary Tract: Adrenal glands are unremarkable. Kidneys are normal, without renal calculi, focal lesion, or hydronephrosis. Bladder is unremarkable.  Stomach/Bowel: Stomach is within normal limits. Appendix appears normal. No evidence of bowel wall thickening, distention, or inflammatory changes.  Vascular/Lymphatic: Trace aortic atherosclerosis. No enlarged abdominal or pelvic lymph nodes.  Reproductive: Uterus and bilateral adnexa are unremarkable.  Other: No abdominal wall hernia or abnormality. No abdominopelvic ascites.  Musculoskeletal: No acute or significant osseous findings.  IMPRESSION: 1. No hydronephrosis or radiodense renal calculus. 2. Cholelithiasis. No additional findings to suggest acute cholecystitis.   Electronically Signed By: Brianna [name] M.D. On: 06/19/2021 19:18   Assessment & Plan:    1. OAB  (overactive bladder) (Primary) Detrol  LA 4mg  daily - Urinalysis, Routine w reflex microscopic  2. Dysuria Urine for culture Macrobid  100mg  BID for 7 days   No follow-ups on file.  Brianna Clara, MD  [facility] [place]

## 2023-10-26 ENCOUNTER — Telehealth: Payer: Self-pay

## 2023-10-26 NOTE — Telephone Encounter (Signed)
 Medication prior authorization request received.  Completed PA request through cover my meds for drug Tolterodiine Tartrate. KEY: [id number]  Approved: Pending

## 2023-10-27 ENCOUNTER — Ambulatory Visit (HOSPITAL_COMMUNITY)
Admission: RE | Admit: 2023-10-27 | Discharge: 2023-10-27 | Disposition: A | Source: Ambulatory Visit | Attending: Gastroenterology | Admitting: Gastroenterology

## 2023-10-27 ENCOUNTER — Other Ambulatory Visit: Payer: Self-pay | Admitting: Gastroenterology

## 2023-10-27 DIAGNOSIS — R748 Abnormal levels of other serum enzymes: Secondary | ICD-10-CM

## 2023-10-27 MED ORDER — GADOBUTROL 1 MMOL/ML IV SOLN
10.0000 mL | Freq: Once | INTRAVENOUS | Status: AC | PRN
  Administered 2023-10-27: 10 mL via INTRAVENOUS

## 2023-10-29 LAB — URINE CULTURE

## 2023-11-05 ENCOUNTER — Ambulatory Visit: Payer: Self-pay | Admitting: Gastroenterology

## 2023-11-05 NOTE — Progress Notes (Signed)
 Brianna [name]: MRCP shows fatty liver and 2 small liver lesions that are indeterminate. No biliary dilatation. I recommend obtaining an AFP and coming in for an appointment. Would like to have the AFP on file before deciding about Rezdiffra. She will need an interpreter when discussing this over the phone.   Mandy: We also need to NIC for repeat MRI. I would like to repeat this in 3 months.   Ladonna: please arrange next available appt with me.

## 2024-01-02 ENCOUNTER — Encounter: Payer: Self-pay | Admitting: Internal Medicine

## 2024-01-31 ENCOUNTER — Telehealth: Payer: Self-pay | Admitting: *Deleted

## 2024-01-31 ENCOUNTER — Ambulatory Visit: Admitting: Gastroenterology

## 2024-01-31 ENCOUNTER — Encounter: Payer: Self-pay | Admitting: Gastroenterology

## 2024-01-31 VITALS — BP 122/60 | HR 71 | Temp 98.2°F | Ht 61.0 in | Wt 215.0 lb

## 2024-01-31 DIAGNOSIS — K769 Liver disease, unspecified: Secondary | ICD-10-CM | POA: Insufficient documentation

## 2024-01-31 DIAGNOSIS — K7581 Nonalcoholic steatohepatitis (NASH): Secondary | ICD-10-CM

## 2024-01-31 DIAGNOSIS — K760 Fatty (change of) liver, not elsewhere classified: Secondary | ICD-10-CM

## 2024-01-31 DIAGNOSIS — Z1211 Encounter for screening for malignant neoplasm of colon: Secondary | ICD-10-CM

## 2024-01-31 NOTE — Telephone Encounter (Signed)
 Pt and pt sister Meade called back and have been given appt details

## 2024-01-31 NOTE — Patient Instructions (Addendum)
 Estamos programando la resonancia magntica de su hgado.  Por favor, hgase los anlisis de [address] en el laboratorio [facility] cuando le sea posible. Debe estar en ayunas para este anlisis.  Nos veremos en 4 a 6 semanas para hablar sobre [phone] o un medicamento en pastillas para ayudarle con su hgado y la prdida de peso.  Me dio mucho gusto verle de nuevo hoy! Valoro nuestra relacin y quiero brindarle una atencin genuina, compasiva y de calidad. Es posible que reciba una encuesta sobre su visita conmigo, y agradezco sus comentarios! Muchas gracias por tomarse el tiempo para completarla. Espero verle pronto.     Therisa MICAEL Stager, PhD, ANP-BC [facility]      We are arranging the MRI of your liver.  Please have blood work done at [facility] when you are able. You need to be fasting for this.  We will see you in 4-6 weeks to talk about Wegovy or a pill to help with your liver and weight loss!   I enjoyed seeing you again today! I value our relationship and want to provide genuine, compassionate, and quality care. You may receive a survey regarding your visit with me, and I welcome your feedback! Thanks so much for taking the time to complete this. I look forward to seeing you again.      Therisa MICAEL Stager, PhD, ANP-BC [facility]astroenterology

## 2024-01-31 NOTE — Telephone Encounter (Signed)
 Current Status: Approved Validity Period: 01/31/2024 - 03/01/2024 Authorization: [id number] (Abdomen MRI (with or without MRCP))   Called pt, no answer and not able to leave VM  Called sister #, [id number] to call back   MRI scheduled for 02/05/2024, arrival 8:15am, npo 4 hrs prior to test

## 2024-01-31 NOTE — Progress Notes (Signed)
 "   Gastroenterology Office Note     Primary Care Physician:  Dasie Lew, NP  Primary Gastroenterologist: Dr Cindie   Chief Complaint   Chief Complaint  Patient presents with   Follow-up    Follow up after procedure and elevated liver enzymes. Pt states she is better to today. Nothing bothers her     History of Present Illness   Brianna [name] is a 54 y.o. female Spanish-speaking with interpreter,  presenting today with a history of  mild to moderately active steatohepatitis, fibrosis stage 0/I of 4 on liver biopsy, initially seen in June 2025 at the request of Dr. Mavis due to hepatic steatosis.  She has had extensive labs including serologies for autoimmune, PBC, iron overload, acute hepatitis serologies, ceruloplasmin, negative celiac panel.  Has had persistently elevated transaminases and alk phos. MRI/MRCP in Oct 2025 with two small indeterminate liver lesions, mild diffuse hepatic steatosis, no CBD dilation.   Due to liver lesions, needs MRI/MRCP now for surveillance. Needs AFP.   Ultrasound in June 2025 with elastography was performed.  The data quality was not ideal and may not be totally accurate.  Mean K PA was 1.9.  She had no splenomegaly.  FibroSure showed F3 fibrosis, and ELF score was significantly elevated at 12.48. ASt and ALT are slightly increased from prior labs several weeks ago.  Interested in [place]. She has no abdominal pain, N/V. No concerns today. Denies any jaundice or pruritus. No unexplained weight loss or lack of appetite. No GERD>    No FH colon polyps or cancer  No known FH liver disease  No personal history of thyroid disease. No FH thyroid disease.  Requested to do Cologuard.   Rare ETOH Omega 3 over the counter, no ashwaganda or St John's Wort, etc  Chamomile (E), oregano (E), bay leaf, hibiscus leaf (in strong preparations could cause elevations in transaminases) and makes a tea: did this for 5 days   Past Medical History:   Diagnosis Date   Ectopic pregnancy    History of kidney stones    Hypertension    PONV (postoperative nausea and vomiting)    Vaginal Pap smear, abnormal     Past Surgical History:  Procedure Laterality Date   ECTOPIC PREGNANCY SURGERY     x 2   EYE SURGERY Bilateral    LIVER BIOPSY N/A 06/08/2023   Procedure: BIOPSY, LIVER;  Surgeon: Mavis Anes, MD;  Location: [facility];  Service: General;  Laterality: N/A;    Current Outpatient Medications  Medication Sig Dispense Refill   ibuprofen (ADVIL) 800 MG tablet Take 800 mg by mouth every 8 (eight) hours as needed (pain).     lisinopril-hydrochlorothiazide (ZESTORETIC) 20-12.5 MG tablet Take 1 tablet by mouth in the morning.     tolterodine  (DETROL  LA) 4 MG 24 hr capsule Take 1 capsule (4 mg total) by mouth daily. 30 capsule 11   Cholecalciferol (VITAMIN D3) 125 MCG (5000 UT) TABS Take 5,000 Units by mouth daily. (Patient not taking: Reported on 01/31/2024)     nitrofurantoin , macrocrystal-monohydrate, (MACROBID ) 100 MG capsule Take 1 capsule (100 mg total) by mouth every 12 (twelve) hours. (Patient not taking: Reported on 01/31/2024) 14 capsule 0   Trospium  Chloride 60 MG CP24 Take 1 capsule (60 mg total) by mouth daily. (Patient not taking: Reported on 01/31/2024) 30 capsule 11   No current facility-administered medications for this visit.    Allergies as of 01/31/2024   (No Known Allergies)    Family  History  Problem Relation Age of Onset   Stroke Father    Cancer Mother        stomach   Diabetes Brother    Hypertension Brother    Diabetes Sister    Hypertension Sister     Social History   Socioeconomic History   Marital status: Single    Spouse name: Not on file   Number of children: 0   Years of education: Not on file   Highest education level: 5th grade  Occupational History   Not on file  Tobacco Use   Smoking status: Never   Smokeless tobacco: Never  Vaping Use   Vaping status: Never Used  Substance and  Sexual Activity   Alcohol use: Not Currently    Comment: socially   Drug use: Never   Sexual activity: Not Currently    Birth control/protection: Post-menopausal  Other Topics Concern   Not on file  Social History Narrative   Not on file   Social Drivers of Health   Tobacco Use: Low Risk (10/24/2023)   Patient History    Smoking Tobacco Use: Never    Smokeless Tobacco Use: Never    Passive Exposure: Not on file  Financial Resource Strain: Medium Risk (08/15/2021)   Overall Financial Resource Strain (CARDIA)    Difficulty of Paying Living Expenses: Somewhat hard  Food Insecurity: No Food Insecurity (11/25/2021)   Hunger Vital Sign    Worried About Running Out of Food in the Last Year: Never true    Ran Out of Food in the Last Year: Never true  Transportation Needs: No Transportation Needs (11/25/2021)   PRAPARE - Administrator, Civil Service (Medical): No    Lack of Transportation (Non-Medical): No  Physical Activity: Inactive (08/15/2021)   Exercise Vital Sign    Days of Exercise per Week: 0 days    Minutes of Exercise per Session: 0 min  Stress: No Stress Concern Present (08/15/2021)   [employer] of Occupational Health - Occupational Stress Questionnaire    Feeling of Stress : Only a little  Social Connections: Moderately Integrated (08/15/2021)   Social Connection and Isolation Panel    Frequency of Communication with Friends and Family: More than three times a week    Frequency of Social Gatherings with Friends and Family: Three times a week    Attends Religious Services: More than 4 times per year    Active Member of Clubs or Organizations: Yes    Attends Banker Meetings: Never    Marital Status: Separated  Intimate Partner Violence: Not At Risk (08/15/2021)   Humiliation, Afraid, Rape, and Kick questionnaire    Fear of Current or Ex-Partner: No    Emotionally Abused: No    Physically Abused: No    Sexually Abused: No  Depression (PHQ2-9):  Medium Risk (08/15/2021)   Depression (PHQ2-9)    PHQ-2 Score: 6  Alcohol Screen: Low Risk (08/15/2021)   Alcohol Screen    Last Alcohol Screening Score (AUDIT): 1  Housing: Low Risk (08/15/2021)   Housing    Last Housing Risk Score: 0  Utilities: Not on file  Health Literacy: Not on file     Review of Systems   Gen: Denies any fever, chills, fatigue, weight loss, lack of appetite.  CV: Denies chest pain, heart palpitations, peripheral edema, syncope.  Resp: Denies shortness of breath at rest or with exertion. Denies wheezing or cough.  GI: Denies dysphagia or odynophagia. Denies jaundice, hematemesis, fecal  incontinence. GU : Denies urinary burning, urinary frequency, urinary hesitancy MS: Denies joint pain, muscle weakness, cramps, or limitation of movement.  Derm: Denies rash, itching, dry skin Psych: Denies depression, anxiety, memory loss, and confusion Heme: Denies bruising, bleeding, and enlarged lymph nodes.   Physical Exam   BP 122/60   Pulse 71   Temp 98.2 F (36.8 C)   Ht 5' 1 (1.549 m)   Wt 215 lb (97.5 kg)   LMP 01/20/2015   BMI 40.62 kg/m  General:   Alert and oriented. Pleasant and cooperative. Well-nourished and well-developed.  Head:  Normocephalic and atraumatic. Eyes:  Without icterus Abdomen:  +BS, soft, non-tender and non-distended. No HSM noted. No guarding or rebound. No masses appreciated.  Rectal:  Deferred  Msk:  Symmetrical without gross deformities. Normal posture. Extremities:  Without edema. Neurologic:  Alert and  oriented x4;  grossly normal neurologically. Skin:  Intact without significant lesions or rashes. Psych:  Alert and cooperative. Normal mood and affect.   Assessment   Brianna [name] is a 54 y.o. female Spanish-speaking with interpreter,  presenting today with a history of  mild to moderately active steatohepatitis, fibrosis stage 0/I of 4 on liver biopsy, initially seen in June 2025 at the request of Dr. 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   MASH: with extensive serologies unrevealing. MRI/MRCP in Oct 2025 with 2 small indeterminate liver lesions and will pursue early interval MRI/MRCP now along with AFP. She is interested in [place], which would be wonderful to pursue after further evaluation with repeat MRI/MRCP to ensure liver lesions stable.    PLAN    Consider Wegovy CBC, CMP, AFP, Vit D, A1c Cologuard MRI/MRCP to follow-up on liver lesions Discuss Wegovy vs Rezdiffra at next office visit in 4-6 weeks   Therisa MICAEL Stager, PhD, ANP-BC [facility]    "

## 2024-02-05 ENCOUNTER — Ambulatory Visit (HOSPITAL_COMMUNITY)
Admission: RE | Admit: 2024-02-05 | Discharge: 2024-02-05 | Disposition: A | Discharge status: Home / Self Care | Source: Ambulatory Visit | Attending: Gastroenterology | Admitting: Gastroenterology

## 2024-02-05 ENCOUNTER — Other Ambulatory Visit (HOSPITAL_COMMUNITY)
Admission: RE | Admit: 2024-02-05 | Discharge: 2024-02-05 | Disposition: A | Source: Ambulatory Visit | Attending: Gastroenterology | Admitting: Gastroenterology

## 2024-02-05 DIAGNOSIS — K769 Liver disease, unspecified: Secondary | ICD-10-CM

## 2024-02-05 DIAGNOSIS — K760 Fatty (change of) liver, not elsewhere classified: Secondary | ICD-10-CM | POA: Diagnosis present

## 2024-02-05 DIAGNOSIS — Z1211 Encounter for screening for malignant neoplasm of colon: Secondary | ICD-10-CM | POA: Diagnosis present

## 2024-02-05 LAB — COMPREHENSIVE METABOLIC PANEL WITH GFR
ALT: 79 U/L — ABNORMAL HIGH (ref 0–44)
AST: 68 U/L — ABNORMAL HIGH (ref 15–41)
Albumin: 4 g/dL (ref 3.5–5.0)
Alkaline Phosphatase: 174 U/L — ABNORMAL HIGH (ref 38–126)
Anion gap: 11 (ref 5–15)
BUN: 16 mg/dL (ref 6–20)
CO2: 28 mmol/L (ref 22–32)
Calcium: 9.2 mg/dL (ref 8.9–10.3)
Chloride: 105 mmol/L (ref 98–111)
Creatinine, Ser: 0.61 mg/dL (ref 0.44–1.00)
GFR, Estimated: >60 mL/min
Glucose, Bld: 100 mg/dL — ABNORMAL HIGH (ref 70–99)
Potassium: 3.7 mmol/L (ref 3.5–5.1)
Sodium: 143 mmol/L (ref 135–145)
Total Bilirubin: 0.7 mg/dL (ref 0.0–1.2)
Total Protein: 7 g/dL (ref 6.5–8.1)

## 2024-02-05 LAB — CBC WITH DIFFERENTIAL/PLATELET
Abs Immature Granulocytes: 0.02 10*3/uL (ref 0.00–0.07)
Basophils Absolute: 0 10*3/uL (ref 0.0–0.1)
Basophils Relative: 1 %
Eosinophils Absolute: 0.2 10*3/uL (ref 0.0–0.5)
Eosinophils Relative: 4 %
HCT: 45.2 % (ref 36.0–46.0)
Hemoglobin: 15.3 g/dL — ABNORMAL HIGH (ref 12.0–15.0)
Immature Granulocytes: 0 %
Lymphocytes Relative: 25 %
Lymphs Abs: 1.2 10*3/uL (ref 0.7–4.0)
MCH: 30.2 pg (ref 26.0–34.0)
MCHC: 33.8 g/dL (ref 30.0–36.0)
MCV: 89.2 fL (ref 80.0–100.0)
Monocytes Absolute: 0.4 10*3/uL (ref 0.1–1.0)
Monocytes Relative: 9 %
Neutro Abs: 2.9 10*3/uL (ref 1.7–7.7)
Neutrophils Relative %: 61 %
Platelets: 153 10*3/uL (ref 150–400)
RBC: 5.07 MIL/uL (ref 3.87–5.11)
RDW: 12.8 % (ref 11.5–15.5)
WBC: 4.8 10*3/uL (ref 4.0–10.5)
nRBC: 0 % (ref 0.0–0.2)

## 2024-02-05 LAB — HEMOGLOBIN A1C
Hgb A1c MFr Bld: 5.6 % (ref 4.8–5.6)
Mean Plasma Glucose: 114.02 mg/dL

## 2024-02-05 LAB — VITAMIN D 25 HYDROXY (VIT D DEFICIENCY, FRACTURES): Vit D, 25-Hydroxy: 27.9 ng/mL — ABNORMAL LOW (ref 30–100)

## 2024-02-05 MED ORDER — GADOBUTROL 1 MMOL/ML IV SOLN
10.0000 mL | Freq: Once | INTRAVENOUS | Status: AC | PRN
  Administered 2024-02-05: 10 mL via INTRAVENOUS

## 2024-02-06 LAB — AFP TUMOR MARKER: AFP, Serum, Tumor Marker: 4 ng/mL (ref 0.0–9.2)

## 2024-02-14 ENCOUNTER — Ambulatory Visit: Payer: Self-pay | Admitting: Gastroenterology

## 2024-02-20 ENCOUNTER — Ambulatory Visit: Admitting: Urology

## 2024-03-20 ENCOUNTER — Ambulatory Visit: Admitting: Gastroenterology
